# Patient Record
Sex: Male | Born: 1940 | Race: White | Hispanic: No | Marital: Married | Smoking: Never smoker
Health system: Southern US, Community
[De-identification: ages and names within clinical notes are randomized; demographics above are authoritative.]

## PROBLEM LIST (undated history)

## (undated) DIAGNOSIS — I2510 Atherosclerotic heart disease of native coronary artery without angina pectoris: Secondary | ICD-10-CM

## (undated) DIAGNOSIS — Z8719 Personal history of other diseases of the digestive system: Secondary | ICD-10-CM

## (undated) DIAGNOSIS — I10 Essential (primary) hypertension: Secondary | ICD-10-CM

## (undated) DIAGNOSIS — Z8711 Personal history of peptic ulcer disease: Secondary | ICD-10-CM

## (undated) DIAGNOSIS — R519 Headache, unspecified: Secondary | ICD-10-CM

## (undated) DIAGNOSIS — K219 Gastro-esophageal reflux disease without esophagitis: Secondary | ICD-10-CM

## (undated) DIAGNOSIS — E785 Hyperlipidemia, unspecified: Secondary | ICD-10-CM

## (undated) DIAGNOSIS — N400 Enlarged prostate without lower urinary tract symptoms: Secondary | ICD-10-CM

## (undated) HISTORY — PX: EYE SURGERY: SHX253

---
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
## 1987-04-22 HISTORY — PX: CORONARY ANGIOPLASTY: SHX604

## 2004-07-03 ENCOUNTER — Ambulatory Visit: Payer: Self-pay | Admitting: Unknown Physician Specialty

## 2007-10-11 ENCOUNTER — Ambulatory Visit: Payer: Self-pay | Admitting: Unknown Physician Specialty

## 2010-12-17 ENCOUNTER — Ambulatory Visit: Payer: Self-pay | Admitting: Unknown Physician Specialty

## 2012-08-18 ENCOUNTER — Observation Stay: Payer: Self-pay | Admitting: Family Medicine

## 2012-08-18 ENCOUNTER — Ambulatory Visit: Payer: Self-pay | Admitting: Internal Medicine

## 2012-08-18 LAB — BASIC METABOLIC PANEL
Anion Gap: 8 (ref 7–16)
BUN: 17 mg/dL (ref 7–18)
Chloride: 106 mmol/L (ref 98–107)
Creatinine: 0.96 mg/dL (ref 0.60–1.30)
EGFR (African American): >60
EGFR (Non-African Amer.): >60
Glucose: 85 mg/dL (ref 65–99)
Osmolality: 280 (ref 275–301)
Potassium: 4 mmol/L (ref 3.5–5.1)
Sodium: 140 mmol/L (ref 136–145)

## 2012-08-18 LAB — CBC
HCT: 40.9 % (ref 40.0–52.0)
MCHC: 35 g/dL (ref 32.0–36.0)
MCV: 90 fL (ref 80–100)
Platelet: 172 10*3/uL (ref 150–440)

## 2012-08-19 LAB — BASIC METABOLIC PANEL
Anion Gap: 3 — ABNORMAL LOW (ref 7–16)
Calcium, Total: 8.9 mg/dL (ref 8.5–10.1)
Co2: 30 mmol/L (ref 21–32)
Creatinine: 0.97 mg/dL (ref 0.60–1.30)
EGFR (African American): >60
EGFR (Non-African Amer.): >60
Glucose: 107 mg/dL — ABNORMAL HIGH (ref 65–99)
Osmolality: 285 (ref 275–301)
Potassium: 4 mmol/L (ref 3.5–5.1)
Sodium: 142 mmol/L (ref 136–145)

## 2012-08-19 LAB — CBC WITH DIFFERENTIAL/PLATELET
Eosinophil %: 2 %
HGB: 14.1 g/dL (ref 13.0–18.0)
Lymphocyte #: 2.3 10*3/uL (ref 1.0–3.6)
Lymphocyte %: 21.7 %
MCH: 30.9 pg (ref 26.0–34.0)
MCHC: 34.7 g/dL (ref 32.0–36.0)
MCV: 89 fL (ref 80–100)
Monocyte #: 0.8 x10 3/mm (ref 0.2–1.0)
Monocyte %: 7.7 %
Neutrophil #: 7.2 10*3/uL — ABNORMAL HIGH (ref 1.4–6.5)
Neutrophil %: 68 %
RBC: 4.54 10*6/uL (ref 4.40–5.90)

## 2012-08-19 LAB — CK TOTAL AND CKMB (NOT AT ARMC)
CK, Total: 90 U/L (ref 35–232)
CK, Total: 65 U/L (ref 35–232)
CK-MB: 0.7 ng/mL (ref 0.5–3.6)
CK-MB: <0.5 ng/mL — ABNORMAL LOW (ref 0.5–3.6)

## 2012-08-19 LAB — LIPID PANEL
Ldl Cholesterol, Calc: 47 mg/dL (ref 0–100)
VLDL Cholesterol, Calc: 28 mg/dL (ref 5–40)

## 2012-08-19 LAB — MAGNESIUM: Magnesium: 1.8 mg/dL

## 2012-10-21 ENCOUNTER — Ambulatory Visit: Payer: Self-pay | Admitting: Unknown Physician Specialty

## 2012-10-25 LAB — PATHOLOGY REPORT

## 2013-12-09 ENCOUNTER — Ambulatory Visit: Payer: Self-pay

## 2014-08-11 NOTE — Discharge Summary (Signed)
PATIENT NAME:  Colin Drake, Colin Drake MR#:  [id number] DATE OF BIRTH:  May 12, 1940  DATE OF ADMISSION:  08/18/2012 DATE OF DISCHARGE:  08/19/2012  CONSULTANTS:  Dr. Isaias Cowman  DISPOSITION:  Home.  ADMISSION DIAGNOSIS: Atypical chest pain and severe GERD.  DISCHARGE DIAGNOSES:    1.  Atypical chest pain. 2.  Severe gastroesophageal reflux disease. 3.  Benign prostatic hypertrophy. 4.  Hypercholesterolemia. 5.  Hypertension.  6.  History of coronary artery disease.   MEDICATIONS AT DISCHARGE:  Proscar 5 mg once daily, Lipitor 40 mg once daily, Zetia 10 mg once daily, atenolol 25 mg once daily, Protonix 40 mg twice daily. This is a new medication aspirin, enteric-coated, 81 mg once daily.  FOLLOW UP:   1.  Follow up with Dr. Saralyn Pilar in 1 to 2 weeks.  2.  Follow up with Dr. Vira Agar, GI in 1 to 2 weeks. 3.  Follow up with Dr. Ezequiel Kayser also in 1 to 2 weeks.   HOSPITAL COURSE:  The patient is a very nice 74 year old gentleman who has history of coronary artery disease with angioplasty about 15 years ago by Dr. Saralyn Pilar. He had a history today of intermittent substernal chest discomfort that was radiating to the right side, pressure and burning like sensation, 8 out of 10.  It is worse with exertion and resolves with rest.  The patient did not have any other associating symptoms. He was seen in the [facility] and he was sent with a possible diagnosis of unstable angina.   In the ER, the patient had a negative EKG, negative troponins and was admitted for a stress test. The stress test was done, Myoview scan showing normal left ventricular function, no evidence of pathology, no defects. The patient had a creatinine 0.96, sodium 140, other electrolytes within normal limits. His LDL was 47, his HDL was 54. His total cholesterol was 129. His troponins were negative x 3. His white count was 9.3 and 10.7 at discharge. He has had some bradycardia with a heart rate in the 40s during this  admission for which his does of atenolol was cut in half.  The patient again was evaluated by Dr. Saralyn Pilar, who is going to continue to follow up as an outpatient. He recommended to continue just medical management. He thinks that the pain was atypical and likely referred to GERD and esophageal spasm.     DISCHARGE INSTRUCTIONS:  We asked the patient to follow up with Dr. Vira Agar for GI  evaluation of the chest pain; the patient agrees as he knows Dr. Vira Agar very well. The patient has been given a prescription for Protonix to be taken twice daily to see if that helps with this problem.  The patient agrees and he is discharged in good condition.   TIME SPENT: I spent about 40 minutes with this discharge.     ____________________________ [place] Sink, MD rsg:ct D: 08/21/2012 11:23:26 ET T: 08/21/2012 15:06:53 ET JOB#: [id number]  cc: [place] Sink, MD, <Dictator> Josean Lycan America Brown MD ELECTRONICALLY SIGNED 08/28/2012 12:20

## 2014-08-11 NOTE — Consult Note (Signed)
PATIENT NAME:  Colin Drake, Colin Drake MR#:  [id number] DATE OF BIRTH:  Apr 07, 1941  DATE OF CONSULTATION:  08/19/2012  REFERRING PHYSICIAN:  Estill Bakes, MD CONSULTING PHYSICIAN:  Isaias Cowman, MD  PRIMARY CARE PHYSICIAN: Christena Flake. Raechel Ache, MD  CHIEF COMPLAINT: Chest pain.   REASON FOR CONSULTATION: Consultation requested for evaluation of chest pain.   HISTORY OF PRESENT ILLNESS: The patient is a 74 year old gentleman with known history of coronary artery disease status post prior angioplasty. The patient presented to [facility] on 08/18/2012 with 2-day history of intermittent chest discomfort described as a burning-like sensation which radiated across his chest. This was not associated with shortness of breath, diaphoresis, nausea or vomiting. Symptoms have been intermittent. He was seen at the [facility] and was sent via EMS to [facility] Emergency Room. The patient was initially found to be bradycardic. EKG was nondiagnostic. The patient ruled out for myocardial infarction by CPK isoenzymes and troponin.   PAST MEDICAL HISTORY:  1. Coronary artery disease status post angioplasty approximately 15 years ago.  2. Hyperlipidemia.  3. Gastroesophageal reflux disease.  4. BPH.  MEDICATIONS:  1. Atenolol 50 mg daily.  2. Lipitor 40 mg daily.  3. Zetia 10 mg daily.  4. Proscar 10 mg daily.  5. Lansoprazole 30 mg daily.   SOCIAL HISTORY: The patient is married, lives with his wife. He denies tobacco abuse.   FAMILY HISTORY: No immediate family history for coronary artery disease or myocardial infarction.   REVIEW OF SYSTEMS:   CONSTITUTIONAL: No fever or chills.  EYES: No blurry vision.  EARS: No hearing loss.  RESPIRATORY: No shortness of breath.  CARDIOVASCULAR: Chest discomfort as described above.  GASTROINTESTINAL: No nausea, vomiting, diarrhea or constipation.  GENITOURINARY: No dysuria or hematuria.  ENDOCRINE: No polyuria or polydipsia.  MUSCULOSKELETAL: No  arthralgias or myalgias.  NEUROLOGICAL: No focal muscle weakness, no numbness.  PSYCHOLOGICAL: No depression or anxiety.   PHYSICAL EXAMINATION:  VITAL SIGNS: Blood pressure was 146/76, pulse 57, respirations 20, temperature 97.4 and pulse oximetry 98%.  HEENT: Pupils equally reactive to light and accommodation.  NECK: Supple without thyromegaly.  LUNGS: Clear.  HEART: Normal JVP. Normal PMI. Regular rate and rhythm. Normal S1, S2. No appreciable gallop, murmur or rub.  ABDOMEN: Soft and nontender.  PULSES: Intact bilaterally.  MUSCULOSKELETAL: Normal muscle tone.  NEUROLOGICAL: The patient is alert and oriented x3. Motor and sensory both grossly intact.   IMPRESSION: A 74 year old gentleman with known history of coronary artery disease, who presents with chest pain, with negative troponin and nondiagnostic electrocardiogram.   RECOMMENDATIONS:  1. Agree with current therapy.  2. Agree with proceeding with Lexiscan sestamibi study.  3. Further recommendations, including invasive cardiac evaluation, pending Lexiscan sestamibi results.   ____________________________ Isaias Cowman, MD ap:OSi D: 08/19/2012 13:19:08 ET T: 08/19/2012 14:19:27 ET JOB#: [id number]  cc: Isaias Cowman, MD, <Dictator> Isaias Cowman MD ELECTRONICALLY SIGNED 08/30/2012 13:46

## 2014-08-11 NOTE — H&P (Signed)
PATIENT NAME:  Colin Drake, Colin Drake MR#:  [id number] DATE OF BIRTH:  07-01-1940  DATE OF ADMISSION:  08/18/2012  PRIMARY CARE PHYSICIAN: Dr. Ezequiel Kayser.   REFERRING PHYSICIAN: Dr. Benjaman Lobe.   CHIEF COMPLAINT: Chest pain.   HISTORY OF PRESENT ILLNESS: The patient is a 74 year old, pleasant, Caucasian male with a past medical history of angioplasty approximately 15 years ago by Dr. Saralyn Pilar. Has been experiencing a 2-day history of intermittent episodes of substernal chest discomfort. The patient has reported that he has been not following up Dr. Saralyn Pilar for the past several years. His intermittent episodes of chest discomfort are pressure-like and burning-like in sensation which is 8 out of 10. It gets worse with exertion and resolves at rest. It has also radiated to the left side of the chest. Denies any shortness of breath, diaphoresis or edema. As the pain is persistently present for the past 2 days, though it is intermittent in nature, the patient went to [facility]. There, as the patient was having chest pain, they diagnosed him with unstable angina, gave him 4 baby aspirin and 1 inch of nitro paste to the anterior chest wall. Following nitro paste attachment, the patient's chest pain significantly improved. The patient was eventually sent over to the ER via EMS. The patient was also found to be bradycardic with heart rate as low as 40s. In the ER, the patient was placed on oxygen, and first set of troponins were negative. A 12-lead EKG has revealed no acute ST-T wave elevations or depressions. Hospitalist team is called to admit the patient to rule out acute MI. During my examination, the patient denies any chest pain or shortness of breath. He is totally symptom-free but concerned about the 2-day history of intermittent episodes of chest pressure.   PAST MEDICAL HISTORY: GERD, hyperlipidemia, benign prostatic hypertrophy, coronary artery disease, status post angioplasty.   PAST SURGICAL  HISTORY: Angioplasty   PSYCHOSOCIAL HISTORY: Lives at home with wife. Denies any history of smoking, alcohol or illicit drug usage.   FAMILY HISTORY: Dad deceased with lung cancer at age 20, and mom deceased with cancer during his childhood and he is not sure about the type of cancer.   HOME MEDICATIONS: Zetia 10 mg once daily, Proscar 10 mg once daily, Lipitor 40 mg once daily, lansoprazole 30 mg once daily, atenolol 50 mg once daily.   REVIEW OF SYSTEMS:   CONSTITUTIONAL: Denies any fever, fatigue. No weight loss or weight gain. The patient had chest pressure, but during my examination, denies any.  EYES: Denies any blurry vision, glaucoma, cataracts.  ENT: No ear pain or discharge, snoring, postnasal drip or sinus pain.  RESPIRATION: Denies any cough, wheezing, painful respirations or COPD.   CARDIOVASCULAR: Comes in with chest pain which is resolved after attaching nitro paste. Denies any palpitations or syncope.  GASTROINTESTINAL: Denies any nausea, vomiting, diarrhea. Has history of GERD.  GENITOURINARY: No dysuria or hematuria.  ENDOCRINE: No polyuria, nocturia, thyroid problems.  HEMATOLOGIC AND LYMPHATIC: No anemia, easy bruising or bleeding.  INTEGUMENTARY: No acne, rash, lesions.  MUSCULOSKELETAL: No joint pain in the neck, shoulder, knee. Denies any history of gout.  NEUROLOGIC: Denies any history of vertigo, ataxia, dementia, headache.  PSYCHIATRIC: Denies any history of ADD, OCD, bipolar disorder.   PHYSICAL EXAMINATION:  VITAL SIGNS: Temperature 98.1, pulse 61, respirations 16 to 18, blood pressure 123/60, pulse ox 94%.  GENERAL APPEARANCE: Not in acute distress. Moderately built and moderately nourished.  HEENT: Normocephalic, atraumatic. Pupils are equally  reacting to light and accommodation. No scleral icterus. No conjunctival injection. Extraocular movements are intact. No sinus tenderness. No postnasal drip. No pharyngeal exudates.  NECK: Supple. No JVD. No thyromegaly.  No lymphadenopathy.  LUNGS: Clear to auscultation bilaterally. No accessory muscle usage. No anterior chest wall tenderness on palpation.  CARDIAC: S1, S2 normal. Regular rhythm. Sinus bradycardia is present. No murmurs. No edema.  GASTROINTESTINAL: Soft. Bowel sounds are positive in all 4 quadrants. Nontender, nondistended. No masses felt. No hepatosplenomegaly.  NEUROLOGIC: Awake, alert, oriented x3. Follows verbal commands. Cranial nerves II through XII are intact. Motor and sensory are intact. Reflexes are 2+.  EXTREMITIES: No edema. No cyanosis. No clubbing.  SKIN: No rashes, lesions. Normal turgor. Warm to touch.  MUSCULOSKELETAL: No joint effusion, tenderness or erythema.  PSYCHIATRIC: Normal mood and affect.   A 12-lead EKG has revealed sinus bradycardia with nonspecific ST-T wave changes. CARDIAC ENZYMES: CK total 90, CPK-MB 0.9, troponin less than 0.02. CBC and BMP are within normal range. Chest x-ray, PA and lateral views, has revealed no evidence of acute cardiopulmonary abnormality.   ASSESSMENT AND PLAN: A 74 year old Caucasian male with a history of angioplasty several years ago. Having 2-day history of intermittent episodes of chest pain which is pressure-like and burning-like in sensation. Was sent over to [facility] ER from [facility].  1. Acute chest pain versus unstable angina: Will rule out acute myocardial infarction. Will admit the patient to telemetry and cycle cardiac biomarkers. Cardiology consult is placed to Dr. Ubaldo Glassing who is on call for Dr. Saralyn Pilar. Will order stress test in a.m. Acute coronary syndrome protocol with oxygen, nitroglycerin, aspirin, beta blocker and statin. The patient will be n.p.o. after midnight, and while the patient is n.p.o., will provide intravenous fluids. Gastrointestinal prophylaxis with Protonix.  2. Sinus bradycardia: The patient will be on telemetry monitoring. Atenolol dose is reduced from 50 mg to 25 mg. Will continue close  monitoring. The patient is currently asymptomatic.  3. Hypotension: His blood pressure medication, atenolol, is reduced from 50 mg to 25 mg in view of bradycardia.  4. Hyperlipidemia: Continue statin.  5. Gastroesophageal reflux disease: Will provide gastrointestinal prophylaxis.  6. Benign prostatic hypertrophy: Resume his home medication.   The diagnosis and plan of care was discussed in detail with the patient. He is FULL CODE.   TOTAL TIME SPENT ON ADMISSION: 50 minutes.   ____________________________ Nicholes Mango, MD ag:gb D: 08/19/2012 03:06:09 ET T: 08/19/2012 04:08:45 ET JOB#: [id number]  cc: Nicholes Mango, MD, <Dictator> Isaias Cowman, MD Christena Flake. Raechel Ache, MD Nicholes Mango MD ELECTRONICALLY SIGNED 08/24/2012 22:40

## 2015-04-22 DIAGNOSIS — Z8619 Personal history of other infectious and parasitic diseases: Secondary | ICD-10-CM

## 2015-04-22 HISTORY — DX: Personal history of other infectious and parasitic diseases: Z86.19

## 2016-05-30 ENCOUNTER — Encounter: Payer: Self-pay | Admitting: *Deleted

## 2016-05-30 NOTE — Discharge Instructions (Signed)
General Anesthesia, Adult, Care After °These instructions provide you with information about caring for yourself after your procedure. Your health care provider may also give you more specific instructions. Your treatment has been planned according to current medical practices, but problems sometimes occur. Call your health care provider if you have any problems or questions after your procedure. °What can I expect after the procedure? °After the procedure, it is common to have: °· Vomiting. °· A sore throat. °· Mental slowness. °It is common to feel: °· Nauseous. °· Cold or shivery. °· Sleepy. °· Tired. °· Sore or achy, even in parts of your body where you did not have surgery. °Follow these instructions at home: °For at least 24 hours after the procedure: °· Do not: °¨ Participate in activities where you could fall or become injured. °¨ Drive. °¨ Use heavy machinery. °¨ Drink alcohol. °¨ Take sleeping pills or medicines that cause drowsiness. °¨ Make important decisions or sign legal documents. °¨ Take care of children on your own. °· Rest. °Eating and drinking °· If you vomit, drink water, juice, or soup when you can drink without vomiting. °· Drink enough fluid to keep your urine clear or pale yellow. °· Make sure you have little or no nausea before eating solid foods. °· Follow the diet recommended by your health care provider. °General instructions °· Have a responsible adult stay with you until you are awake and alert. °· Return to your normal activities as told by your health care provider. Ask your health care provider what activities are safe for you. °· Take over-the-counter and prescription medicines only as told by your health care provider. °· If you smoke, do not smoke without supervision. °· Keep all follow-up visits as told by your health care provider. This is important. °Contact a health care provider if: °· You continue to have nausea or vomiting at home, and medicines are not helpful. °· You  cannot drink fluids or start eating again. °· You cannot urinate after 8-12 hours. °· You develop a skin rash. °· You have fever. °· You have increasing redness at the site of your procedure. °Get help right away if: °· You have difficulty breathing. °· You have chest pain. °· You have unexpected bleeding. °· You feel that you are having a life-threatening or urgent problem. °This information is not intended to replace advice given to you by your health care provider. Make sure you discuss any questions you have with your health care provider. °Document Released: 07/14/2000 Document Revised: 09/10/2015 Document Reviewed: 03/22/2015 °Elsevier Interactive Patient Education © 2017 [employer] ° ° °Cataract Surgery, Care After °Refer to this sheet in the next few weeks. These instructions provide you with information about caring for yourself after your procedure. Your health care provider may also give you more specific instructions. Your treatment has been planned according to current medical practices, but problems sometimes occur. Call your health care provider if you have any problems or questions after your procedure. °What can I expect after the procedure? °After the procedure, it is common to have: °· Itching. °· Discomfort. °· Fluid discharge. °· Sensitivity to light and to touch. °· Bruising. °Follow these instructions at home: °Eye Care °· Check your eye every day for signs of infection. Watch for: °¨ Redness, swelling, or pain. °¨ Fluid, blood, or pus. °¨ Warmth. °¨ Bad smell. °Activity °· Avoid strenuous activities, such as playing contact sports, for as long as told by your health care provider. °· Do not   drive or operate heavy machinery until your health care provider approves. °· Do not bend or lift heavy objects . Bending increases pressure in the eye. You can walk, climb stairs, and do light household chores. °· Ask your health care provider when you can return to work. If you work in a dusty  environment, you may be advised to wear protective eyewear for a period of time. °General instructions °· Take or apply over-the-counter and prescription medicines only as told by your health care provider. This includes eye drops. °· Do not touch or rub your eyes. °· If you were given a protective shield, wear it as told by your health care provider. If you were not given a protective shield, wear sunglasses as told by your health care provider to protect your eyes. °· Keep the area around your eye clean and dry. Avoid swimming or allowing water to hit you directly in the face while showering until told by your health care provider. Keep soap and shampoo out of your eyes. °· Do not put a contact lens into the affected eye or eyes until your health care provider approves. °· Keep all follow-up visits as told by your health care provider. This is important. °Contact a health care provider if: ° °· You have increased bruising around your eye. °· You have pain that is not helped with medicine. °· You have a fever. °· You have redness, swelling, or pain in your eye. °· You have fluid, blood, or pus coming from your incision. °· Your vision gets worse. °Get help right away if: °· You have sudden vision loss. °This information is not intended to replace advice given to you by your health care provider. Make sure you discuss any questions you have with your health care provider. °Document Released: 10/25/2004 Document Revised: 08/16/2015 Document Reviewed: 02/15/2015 °Elsevier Interactive Patient Education © 2017 Elsevier Inc. ° °

## 2016-06-02 ENCOUNTER — Encounter: Payer: Self-pay | Admitting: Anesthesiology

## 2016-06-04 ENCOUNTER — Ambulatory Visit: Payer: Medicare Other | Admitting: Anesthesiology

## 2016-06-04 ENCOUNTER — Encounter
Admission: RE | Disposition: A | Discharge status: Home / Self Care | Payer: Self-pay | Source: Ambulatory Visit | Attending: Ophthalmology

## 2016-06-04 ENCOUNTER — Ambulatory Visit
Admission: RE | Admit: 2016-06-04 | Discharge: 2016-06-04 | Disposition: A | Discharge status: Home / Self Care | Payer: Medicare Other | Source: Ambulatory Visit | Attending: Ophthalmology | Admitting: Ophthalmology

## 2016-06-04 DIAGNOSIS — K219 Gastro-esophageal reflux disease without esophagitis: Secondary | ICD-10-CM | POA: Diagnosis not present

## 2016-06-04 DIAGNOSIS — H2512 Age-related nuclear cataract, left eye: Secondary | ICD-10-CM | POA: Insufficient documentation

## 2016-06-04 DIAGNOSIS — I10 Essential (primary) hypertension: Secondary | ICD-10-CM | POA: Diagnosis not present

## 2016-06-04 DIAGNOSIS — Z79899 Other long term (current) drug therapy: Secondary | ICD-10-CM | POA: Diagnosis not present

## 2016-06-04 HISTORY — PX: CATARACT EXTRACTION W/PHACO: SHX586

## 2016-06-04 HISTORY — DX: Essential (primary) hypertension: I10

## 2016-06-04 HISTORY — DX: Gastro-esophageal reflux disease without esophagitis: K21.9

## 2016-06-04 HISTORY — DX: Benign prostatic hyperplasia without lower urinary tract symptoms: N40.0

## 2016-06-04 SURGERY — PHACOEMULSIFICATION, CATARACT, WITH IOL INSERTION
Anesthesia: Monitor Anesthesia Care | Site: Eye | Laterality: Left | Wound class: Clean

## 2016-06-04 MED ORDER — ARMC OPHTHALMIC DILATING DROPS
1.0000 "application " | OPHTHALMIC | Status: DC | PRN
  Administered 2016-06-04 (×3): 1 via OPHTHALMIC

## 2016-06-04 MED ORDER — ACETAMINOPHEN 325 MG PO TABS: 325.0000 mg | ORAL_TABLET | ORAL | Status: DC | PRN

## 2016-06-04 MED ORDER — CEFUROXIME OPHTHALMIC INJECTION 1 MG/0.1 ML
INJECTION | OPHTHALMIC | Status: DC | PRN
  Administered 2016-06-04: .3 mL via OPHTHALMIC

## 2016-06-04 MED ORDER — ACETAMINOPHEN 160 MG/5ML PO SOLN: 325.0000 mg | ORAL | Status: DC | PRN

## 2016-06-04 MED ORDER — NA HYALUR & NA CHOND-NA HYALUR 0.4-0.35 ML IO KIT
PACK | INTRAOCULAR | Status: DC | PRN
  Administered 2016-06-04: 1 mL via INTRAOCULAR

## 2016-06-04 MED ORDER — LIDOCAINE HCL (PF) 2 % IJ SOLN
INTRAOCULAR | Status: DC | PRN
  Administered 2016-06-04: 2 mL via INTRAOCULAR

## 2016-06-04 MED ORDER — LACTATED RINGERS IV SOLN: INTRAVENOUS | Status: DC

## 2016-06-04 MED ORDER — FENTANYL CITRATE (PF) 100 MCG/2ML IJ SOLN
INTRAMUSCULAR | Status: DC | PRN
  Administered 2016-06-04: 50 ug via INTRAVENOUS

## 2016-06-04 MED ORDER — EPINEPHRINE PF 1 MG/ML IJ SOLN
INTRAOCULAR | Status: DC | PRN
  Administered 2016-06-04: 54 mL via OPHTHALMIC

## 2016-06-04 MED ORDER — BRIMONIDINE TARTRATE-TIMOLOL 0.2-0.5 % OP SOLN
OPHTHALMIC | Status: DC | PRN
  Administered 2016-06-04: 1 [drp] via OPHTHALMIC

## 2016-06-04 MED ORDER — MIDAZOLAM HCL 2 MG/2ML IJ SOLN
INTRAMUSCULAR | Status: DC | PRN
  Administered 2016-06-04: 2 mg via INTRAVENOUS

## 2016-06-04 MED ORDER — LACTATED RINGERS IV SOLN: 500.0000 mL | INTRAVENOUS | Status: DC

## 2016-06-04 MED ORDER — MOXIFLOXACIN HCL 0.5 % OP SOLN
1.0000 [drp] | OPHTHALMIC | Status: DC | PRN
  Administered 2016-06-04 (×3): 1 [drp] via OPHTHALMIC

## 2016-06-04 SURGICAL SUPPLY — 26 items
CANNULA ANT/CHMB 27GA (MISCELLANEOUS) ×3 IMPLANT
CARTRIDGE ABBOTT (MISCELLANEOUS) IMPLANT
GLOVE SURG LX 7.5 STRW (GLOVE) ×2
GLOVE SURG LX STRL 7.5 STRW (GLOVE) ×1 IMPLANT
GLOVE SURG TRIUMPH 8.0 PF LTX (GLOVE) ×3 IMPLANT
GOWN STRL REUS W/ TWL LRG LVL3 (GOWN DISPOSABLE) ×2 IMPLANT
GOWN STRL REUS W/TWL LRG LVL3 (GOWN DISPOSABLE) ×4
LENS IOL TECNIS TRC I 225 15.0 ×1 IMPLANT
LENS IOL TORIC 15.0 ×3 IMPLANT
MARKER SKIN DUAL TIP RULER LAB (MISCELLANEOUS) ×3 IMPLANT
NDL RETROBULBAR .5 NSTRL (NEEDLE) IMPLANT
NEEDLE FILTER BLUNT 18X 1/2SAF (NEEDLE) ×2
NEEDLE FILTER BLUNT 18X1 1/2 (NEEDLE) ×1 IMPLANT
PACK CATARACT BRASINGTON (MISCELLANEOUS) ×3 IMPLANT
PACK EYE AFTER SURG (MISCELLANEOUS) ×3 IMPLANT
PACK OPTHALMIC (MISCELLANEOUS) ×3 IMPLANT
RING MALYGIN 7.0 (MISCELLANEOUS) IMPLANT
SUT ETHILON 10-0 CS-B-6CS-B-6 (SUTURE)
SUT VICRYL  9 0 (SUTURE)
SUT VICRYL 9 0 (SUTURE) IMPLANT
SUTURE EHLN 10-0 CS-B-6CS-B-6 (SUTURE) IMPLANT
SYR 3ML LL SCALE MARK (SYRINGE) ×3 IMPLANT
SYR 5ML LL (SYRINGE) ×3 IMPLANT
SYR TB 1ML LUER SLIP (SYRINGE) ×3 IMPLANT
WATER STERILE IRR 250ML POUR (IV SOLUTION) ×3 IMPLANT
WIPE NON LINTING 3.25X3.25 (MISCELLANEOUS) ×3 IMPLANT

## 2016-06-04 NOTE — H&P (Signed)
The History and Physical notes are on paper, have been signed, and are to be scanned. The patient remains stable and unchanged from the H&P.   Previous H&P reviewed, patient examined, and there are no changes.  Colin Drake 06/04/2016 9:24 AM

## 2016-06-04 NOTE — Op Note (Signed)
LOCATION:  [place]   PREOPERATIVE DIAGNOSIS:  Nuclear sclerotic cataract of the left eye.  H25.12  POSTOPERATIVE DIAGNOSIS:  Nuclear sclerotic cataract of the left eye.   PROCEDURE:  Phacoemulsification with Toric posterior chamber intraocular lens placement of the left eye.   LENS:  Implant Name Type Inv. Item Serial No. Manufacturer Lot No. LRB No. Used  Tecnis Toric Aspheric IOL     [id number] [facility]   Left 1   [id number] Toric intraocular lens with 2.25 diopters of cylindrical power with axis orientation at 172 degrees.   ULTRASOUND TIME: 14 % of 0 minutes, 59 seconds.  CDE 8.5   SURGEON:  Wyonia Hough, MD   ANESTHESIA:  Topical with tetracaine drops and 2% Xylocaine jelly, augmented with 1% preservative-free intracameral lidocaine.  COMPLICATIONS:  None.   DESCRIPTION OF PROCEDURE:  The patient was identified in the holding room and transported to the operating suite and placed in the supine position under the operating microscope.  The left eye was identified as the operative eye, and it was prepped and draped in the usual sterile ophthalmic fashion.    A clear-corneal paracentesis incision was made at the 1:30 position.  0.5 ml of preservative-free 1% lidocaine was injected into the anterior chamber. The anterior chamber was filled with Viscoat.  A 2.4 millimeter near clear corneal incision was then made at the 10:30 position.  A cystotome and capsulorrhexis forceps were then used to make a curvilinear capsulorrhexis.  Hydrodissection and hydrodelineation were then performed using balanced salt solution.   Phacoemulsification was then used in stop and chop fashion to remove the lens, nucleus and epinucleus.  The remaining cortex was aspirated using the irrigation and aspiration handpiece.  Provisc viscoelastic was then placed into the capsular bag to distend it for lens placement.  The Verion digital marker was used to align the implant at the intended axis.   A 15.0 diopter lens was then injected into the capsular bag.  It was rotated clockwise until the axis marks on the lens were approximately 15 degrees in the counterclockwise direction to the intended alignment.  The viscoelastic was aspirated from the eye using the irrigation aspiration handpiece.  Then, a Koch spatula through the sideport incision was used to rotate the lens in a clockwise direction until the axis markings of the intraocular lens were lined up with the Verion alignment.  Balanced salt solution was then used to hydrate the wounds. Cefuroxime 0.1 ml of a 10mg /ml solution was injected into the anterior chamber for a dose of 1 mg of intracameral antibiotic at the completion of the case.    The eye was noted to have a physiologic pressure and there was no wound leak noted.   Timolol and Brimonidine drops were applied to the eye.  The patient was taken to the recovery room in stable condition having had no complications of anesthesia or surgery.  Handy Mcloud 06/04/2016, 10:49 AM

## 2016-06-04 NOTE — Anesthesia Postprocedure Evaluation (Signed)
Anesthesia Post Note  Patient: Colin Drake  Procedure(s) Performed: Procedure(s) (LRB): CATARACT EXTRACTION PHACO AND INTRAOCULAR LENS PLACEMENT (IOC)  Left Toric lens (Left)  Patient location during evaluation: PACU Anesthesia Type: MAC Level of consciousness: awake and alert Pain management: pain level controlled Vital Signs Assessment: post-procedure vital signs reviewed and stable Respiratory status: spontaneous breathing, nonlabored ventilation and respiratory function stable Cardiovascular status: stable and blood pressure returned to baseline Anesthetic complications: no    Kimori Tartaglia D Tiaira Arambula

## 2016-06-04 NOTE — Anesthesia Preprocedure Evaluation (Signed)
Anesthesia Evaluation  Patient identified by MRN, date of birth, ID band Patient awake    Reviewed: Allergy & Precautions, H&P , NPO status , Patient's Chart, lab work & pertinent test results, reviewed documented beta blocker date and time   Airway Mallampati: II  TM Distance: >3 FB Neck ROM: full    Dental no notable dental hx.    Pulmonary neg pulmonary ROS,    Pulmonary exam normal breath sounds clear to auscultation       Cardiovascular Exercise Tolerance: Good hypertension,  Rhythm:regular Rate:Normal     Neuro/Psych negative neurological ROS  negative psych ROS   GI/Hepatic Neg liver ROS, GERD  Medicated,  Endo/Other  negative endocrine ROS  Renal/GU negative Renal ROS  negative genitourinary   Musculoskeletal   Abdominal   Peds  Hematology negative hematology ROS (+)   Anesthesia Other Findings   Reproductive/Obstetrics negative OB ROS                             Anesthesia Physical Anesthesia Plan  ASA: II  Anesthesia Plan: MAC   Post-op Pain Management:    Induction:   Airway Management Planned:   Additional Equipment:   Intra-op Plan:   Post-operative Plan:   Informed Consent:   Plan Discussed with: CRNA  Anesthesia Plan Comments:         Anesthesia Quick Evaluation

## 2016-06-04 NOTE — Transfer of Care (Signed)
Immediate Anesthesia Transfer of Care Note  Patient: Colin Drake  Procedure(s) Performed: Procedure(s) with comments: CATARACT EXTRACTION PHACO AND INTRAOCULAR LENS PLACEMENT (IOC)  Left Toric lens (Left) - Toric lens  Patient Location: PACU  Anesthesia Type: MAC  Level of Consciousness: awake, alert  and patient cooperative  Airway and Oxygen Therapy: Patient Spontanous Breathing and Patient connected to supplemental oxygen  Post-op Assessment: Post-op Vital signs reviewed, Patient's Cardiovascular Status Stable, Respiratory Function Stable, Patent Airway and No signs of Nausea or vomiting  Post-op Vital Signs: Reviewed and stable  Complications: No apparent anesthesia complications

## 2016-06-05 ENCOUNTER — Encounter: Payer: Self-pay | Admitting: Ophthalmology

## 2016-06-20 ENCOUNTER — Encounter: Payer: Self-pay | Admitting: *Deleted

## 2016-06-20 NOTE — Discharge Instructions (Signed)
Cataract Surgery, Care After °Refer to this sheet in the next few weeks. These instructions provide you with information about caring for yourself after your procedure. Your health care provider may also give you more specific instructions. Your treatment has been planned according to current medical practices, but problems sometimes occur. Call your health care provider if you have any problems or questions after your procedure. °What can I expect after the procedure? °After the procedure, it is common to have: °· Itching. °· Discomfort. °· Fluid discharge. °· Sensitivity to light and to touch. °· Bruising. °Follow these instructions at home: °Eye Care  °· Check your eye every day for signs of infection. Watch for: °¨ Redness, swelling, or pain. °¨ Fluid, blood, or pus. °¨ Warmth. °¨ Bad smell. °Activity  °· Avoid strenuous activities, such as playing contact sports, for as long as told by your health care provider. °· Do not drive or operate heavy machinery until your health care provider approves. °· Do not bend or lift heavy objects . Bending increases pressure in the eye. You can walk, climb stairs, and do light household chores. °· Ask your health care provider when you can return to work. If you work in a dusty environment, you may be advised to wear protective eyewear for a period of time. °General instructions  °· Take or apply over-the-counter and prescription medicines only as told by your health care provider. This includes eye drops. °· Do not touch or rub your eyes. °· If you were given a protective shield, wear it as told by your health care provider. If you were not given a protective shield, wear sunglasses as told by your health care provider to protect your eyes. °· Keep the area around your eye clean and dry. Avoid swimming or allowing water to hit you directly in the face while showering until told by your health care provider. Keep soap and shampoo out of your eyes. °· Do not put a contact lens  into the affected eye or eyes until your health care provider approves. °· Keep all follow-up visits as told by your health care provider. This is important. °Contact a health care provider if: ° °· You have increased bruising around your eye. °· You have pain that is not helped with medicine. °· You have a fever. °· You have redness, swelling, or pain in your eye. °· You have fluid, blood, or pus coming from your incision. °· Your vision gets worse. °Get help right away if: °· You have sudden vision loss. °This information is not intended to replace advice given to you by your health care provider. Make sure you discuss any questions you have with your health care provider. °Document Released: 10/25/2004 Document Revised: 08/16/2015 Document Reviewed: 02/15/2015 °Elsevier Interactive Patient Education © 2017 [employer] ° ° ° ° °General Anesthesia, Adult, Care After °These instructions provide you with information about caring for yourself after your procedure. Your health care provider may also give you more specific instructions. Your treatment has been planned according to current medical practices, but problems sometimes occur. Call your health care provider if you have any problems or questions after your procedure. °What can I expect after the procedure? °After the procedure, it is common to have: °· Vomiting. °· A sore throat. °· Mental slowness. °It is common to feel: °· Nauseous. °· Cold or shivery. °· Sleepy. °· Tired. °· Sore or achy, even in parts of your body where you did not have surgery. °Follow these instructions at   home: °For at least 24 hours after the procedure:  °· Do not: °¨ Participate in activities where you could fall or become injured. °¨ Drive. °¨ Use heavy machinery. °¨ Drink alcohol. °¨ Take sleeping pills or medicines that cause drowsiness. °¨ Make important decisions or sign legal documents. °¨ Take care of children on your own. °· Rest. °Eating and drinking  °· If you vomit, drink  water, juice, or soup when you can drink without vomiting. °· Drink enough fluid to keep your urine clear or pale yellow. °· Make sure you have little or no nausea before eating solid foods. °· Follow the diet recommended by your health care provider. °General instructions  °· Have a responsible adult stay with you until you are awake and alert. °· Return to your normal activities as told by your health care provider. Ask your health care provider what activities are safe for you. °· Take over-the-counter and prescription medicines only as told by your health care provider. °· If you smoke, do not smoke without supervision. °· Keep all follow-up visits as told by your health care provider. This is important. °Contact a health care provider if: °· You continue to have nausea or vomiting at home, and medicines are not helpful. °· You cannot drink fluids or start eating again. °· You cannot urinate after 8-12 hours. °· You develop a skin rash. °· You have fever. °· You have increasing redness at the site of your procedure. °Get help right away if: °· You have difficulty breathing. °· You have chest pain. °· You have unexpected bleeding. °· You feel that you are having a life-threatening or urgent problem. °This information is not intended to replace advice given to you by your health care provider. Make sure you discuss any questions you have with your health care provider. °Document Released: 07/14/2000 Document Revised: 09/10/2015 Document Reviewed: 03/22/2015 °Elsevier Interactive Patient Education © 2017 [employer] ° °

## 2016-06-25 ENCOUNTER — Encounter
Admission: RE | Disposition: A | Discharge status: Home / Self Care | Payer: Self-pay | Source: Ambulatory Visit | Attending: Ophthalmology

## 2016-06-25 ENCOUNTER — Ambulatory Visit: Payer: Medicare Other | Admitting: Anesthesiology

## 2016-06-25 ENCOUNTER — Ambulatory Visit
Admission: RE | Admit: 2016-06-25 | Discharge: 2016-06-25 | Disposition: A | Discharge status: Home / Self Care | Payer: Medicare Other | Source: Ambulatory Visit | Attending: Ophthalmology | Admitting: Ophthalmology

## 2016-06-25 DIAGNOSIS — Z79899 Other long term (current) drug therapy: Secondary | ICD-10-CM | POA: Diagnosis not present

## 2016-06-25 DIAGNOSIS — Z7982 Long term (current) use of aspirin: Secondary | ICD-10-CM | POA: Insufficient documentation

## 2016-06-25 DIAGNOSIS — I10 Essential (primary) hypertension: Secondary | ICD-10-CM | POA: Insufficient documentation

## 2016-06-25 DIAGNOSIS — Y831 Surgical operation with implant of artificial internal device as the cause of abnormal reaction of the patient, or of later complication, without mention of misadventure at the time of the procedure: Secondary | ICD-10-CM | POA: Diagnosis not present

## 2016-06-25 DIAGNOSIS — K219 Gastro-esophageal reflux disease without esophagitis: Secondary | ICD-10-CM | POA: Diagnosis not present

## 2016-06-25 DIAGNOSIS — T8522XA Displacement of intraocular lens, initial encounter: Secondary | ICD-10-CM | POA: Insufficient documentation

## 2016-06-25 HISTORY — PX: REPOSITION OF LENS: SHX6069

## 2016-06-25 SURGERY — REPOSITIONING, IOL
Anesthesia: Monitor Anesthesia Care | Site: Eye | Laterality: Left | Wound class: Clean

## 2016-06-25 MED ORDER — LIDOCAINE HCL (PF) 2 % IJ SOLN
INTRAOCULAR | Status: DC | PRN
  Administered 2016-06-25: 1 mL via INTRAOCULAR

## 2016-06-25 MED ORDER — EPINEPHRINE PF 1 MG/ML IJ SOLN
INTRAMUSCULAR | Status: DC | PRN
  Administered 2016-06-25: 19 mL via OPHTHALMIC

## 2016-06-25 MED ORDER — MIDAZOLAM HCL 2 MG/2ML IJ SOLN
INTRAMUSCULAR | Status: DC | PRN
  Administered 2016-06-25: 2 mg via INTRAVENOUS

## 2016-06-25 MED ORDER — BRIMONIDINE TARTRATE-TIMOLOL 0.2-0.5 % OP SOLN
OPHTHALMIC | Status: DC | PRN
  Administered 2016-06-25: 1 [drp] via OPHTHALMIC

## 2016-06-25 MED ORDER — ARMC OPHTHALMIC DILATING DROPS
1.0000 "application " | OPHTHALMIC | Status: DC | PRN
  Administered 2016-06-25 (×3): 1 via OPHTHALMIC

## 2016-06-25 MED ORDER — ACETAMINOPHEN 160 MG/5ML PO SOLN: 325.0000 mg | ORAL | Status: DC | PRN

## 2016-06-25 MED ORDER — FENTANYL CITRATE (PF) 100 MCG/2ML IJ SOLN
INTRAMUSCULAR | Status: DC | PRN
  Administered 2016-06-25: 100 ug via INTRAVENOUS

## 2016-06-25 MED ORDER — MOXIFLOXACIN HCL 0.5 % OP SOLN
1.0000 [drp] | OPHTHALMIC | Status: DC | PRN
  Administered 2016-06-25 (×3): 1 [drp] via OPHTHALMIC

## 2016-06-25 MED ORDER — CEFUROXIME OPHTHALMIC INJECTION 1 MG/0.1 ML
INJECTION | OPHTHALMIC | Status: DC | PRN
  Administered 2016-06-25: 0.1 mL via OPHTHALMIC

## 2016-06-25 MED ORDER — ACETAMINOPHEN 325 MG PO TABS: 325.0000 mg | ORAL_TABLET | ORAL | Status: DC | PRN

## 2016-06-25 MED ORDER — NA HYALUR & NA CHOND-NA HYALUR 0.4-0.35 ML IO KIT
PACK | INTRAOCULAR | Status: DC | PRN
  Administered 2016-06-25: 1 mL via INTRAOCULAR

## 2016-06-25 SURGICAL SUPPLY — 24 items
CANNULA ANT/CHMB 27GA (MISCELLANEOUS) ×3 IMPLANT
CARTRIDGE ABBOTT (MISCELLANEOUS) IMPLANT
GLOVE SURG LX 7.5 STRW (GLOVE) ×2
GLOVE SURG LX STRL 7.5 STRW (GLOVE) ×1 IMPLANT
GLOVE SURG TRIUMPH 8.0 PF LTX (GLOVE) ×3 IMPLANT
GOWN STRL REUS W/ TWL LRG LVL3 (GOWN DISPOSABLE) ×2 IMPLANT
GOWN STRL REUS W/TWL LRG LVL3 (GOWN DISPOSABLE) ×4
MARKER SKIN DUAL TIP RULER LAB (MISCELLANEOUS) ×3 IMPLANT
NDL RETROBULBAR .5 NSTRL (NEEDLE) IMPLANT
NEEDLE FILTER BLUNT 18X 1/2SAF (NEEDLE) ×2
NEEDLE FILTER BLUNT 18X1 1/2 (NEEDLE) ×1 IMPLANT
PACK CATARACT BRASINGTON (MISCELLANEOUS) ×3 IMPLANT
PACK EYE AFTER SURG (MISCELLANEOUS) ×3 IMPLANT
PACK OPTHALMIC (MISCELLANEOUS) ×3 IMPLANT
RING MALYGIN 7.0 (MISCELLANEOUS) IMPLANT
SUT ETHILON 10-0 CS-B-6CS-B-6 (SUTURE) ×3
SUT VICRYL  9 0 (SUTURE)
SUT VICRYL 9 0 (SUTURE) IMPLANT
SUTURE EHLN 10-0 CS-B-6CS-B-6 (SUTURE) ×1 IMPLANT
SYR 3ML LL SCALE MARK (SYRINGE) ×3 IMPLANT
SYR 5ML LL (SYRINGE) ×3 IMPLANT
SYR TB 1ML LUER SLIP (SYRINGE) ×3 IMPLANT
WATER STERILE IRR 250ML POUR (IV SOLUTION) ×3 IMPLANT
WIPE NON LINTING 3.25X3.25 (MISCELLANEOUS) ×3 IMPLANT

## 2016-06-25 NOTE — Anesthesia Preprocedure Evaluation (Signed)
Anesthesia Evaluation  Patient identified by MRN, date of birth, ID band Patient awake    Reviewed: Allergy & Precautions, H&P , NPO status , Patient's Chart, lab work & pertinent test results, reviewed documented beta blocker date and time   Airway Mallampati: II  TM Distance: >3 FB Neck ROM: full    Dental no notable dental hx.    Pulmonary neg pulmonary ROS,    Pulmonary exam normal breath sounds clear to auscultation       Cardiovascular Exercise Tolerance: Good hypertension,  Rhythm:regular Rate:Normal     Neuro/Psych negative neurological ROS  negative psych ROS   GI/Hepatic Neg liver ROS, GERD  Medicated,  Endo/Other  negative endocrine ROS  Renal/GU negative Renal ROS  negative genitourinary   Musculoskeletal   Abdominal   Peds  Hematology negative hematology ROS (+)   Anesthesia Other Findings   Reproductive/Obstetrics negative OB ROS                             Anesthesia Physical  Anesthesia Plan  ASA: II  Anesthesia Plan: MAC   Post-op Pain Management:    Induction:   Airway Management Planned:   Additional Equipment:   Intra-op Plan:   Post-operative Plan:   Informed Consent:   Plan Discussed with: CRNA  Anesthesia Plan Comments:         Anesthesia Quick Evaluation

## 2016-06-25 NOTE — Anesthesia Procedure Notes (Signed)
Procedure Name: MAC Performed by: Mayme Genta Pre-anesthesia Checklist: Patient identified, Emergency Drugs available, Suction available, Timeout performed and Patient being monitored Patient Re-evaluated:Patient Re-evaluated prior to inductionOxygen Delivery Method: Nasal cannula Placement Confirmation: positive ETCO2

## 2016-06-25 NOTE — Op Note (Signed)
OPERATIVE NOTE  Colin Drake [id number] 06/25/2016   PREOPERATIVE DIAGNOSIS:  Malposition of intrataocular lens left eye   POSTOPERATIVE DIAGNOSIS:  Malposition of intrataocular lens left eye     PROCEDURE:  Reposition of intraocular lens placement of the left eye     SURGEON:  Wyonia Hough, MD   ANESTHESIA:  Topical with tetracaine drops and 2% Xylocaine jelly, augmented with 1% preservative-free intracameral lidocaine.    COMPLICATIONS:  None.   DESCRIPTION OF PROCEDURE:  The patient was identified in the holding room and transported to the operating room and placed in the supine position under the operating microscope.  The left eye was identified as the operative eye and it was prepped and draped in the usual sterile ophthalmic fashion.   A 1 millimeter clear-corneal paracentesis was made at the 1:30 position.  0.5 ml of preservative-free 1% lidocaine was injected into the anterior chamber.  The anterior chamber was filled with Provisc viscoelastic.  A 2.4 millimeter keratome was used to make a near-clear corneal incision at the 10:30 position.  . Provisc was used to viscodissect the IOL.  A Sinskey hook was used top rotate the IOL to a position of 166 degrees.  The remaining viscoelastic was aspirated. Two 10-0 nylon sutures were placed through the 2.4 mm incision.   Wounds were hydrated with balanced salt solution.  The anterior chamber was inflated to a physiologic pressure with balanced salt solution.  No wound leaks were noted. Cefuroxime 0.1 ml of a 10mg /ml solution was injected into the anterior chamber for a dose of 1 mg of intracameral antibiotic at the completion of the case.   Timolol and Brimonidine drops were applied to the eye.  The patient was taken to the recovery room in stable condition without complications of anesthesia or surgery.  [name] 06/25/2016, 11:32 AM

## 2016-06-25 NOTE — Anesthesia Postprocedure Evaluation (Signed)
Anesthesia Post Note  Patient: Colin Drake  Procedure(s) Performed: Procedure(s) (LRB): REPOSITION OF LENS (Left)  Patient location during evaluation: PACU Anesthesia Type: MAC Level of consciousness: awake and alert Pain management: pain level controlled Vital Signs Assessment: post-procedure vital signs reviewed and stable Respiratory status: spontaneous breathing, nonlabored ventilation, respiratory function stable and patient connected to nasal cannula oxygen Cardiovascular status: stable and blood pressure returned to baseline Anesthetic complications: no    Alisa Graff

## 2016-06-25 NOTE — H&P (Signed)
The History and Physical notes are on paper, have been signed, and are to be scanned. The patient remains stable and unchanged from the H&P.   Previous H&P reviewed, patient examined, and there are no changes.  Colin Drake 06/25/2016 10:17 AM

## 2016-06-25 NOTE — Transfer of Care (Signed)
Immediate Anesthesia Transfer of Care Note  Patient: Colin Drake  Procedure(s) Performed: Procedure(s): REPOSITION OF LENS (Left)  Patient Location: PACU  Anesthesia Type: MAC  Level of Consciousness: awake, alert  and patient cooperative  Airway and Oxygen Therapy: Patient Spontanous Breathing and Patient connected to supplemental oxygen  Post-op Assessment: Post-op Vital signs reviewed, Patient's Cardiovascular Status Stable, Respiratory Function Stable, Patent Airway and No signs of Nausea or vomiting  Post-op Vital Signs: Reviewed and stable  Complications: No apparent anesthesia complications

## 2016-06-26 ENCOUNTER — Encounter: Payer: Self-pay | Admitting: Ophthalmology

## 2016-07-23 ENCOUNTER — Encounter: Payer: Self-pay | Admitting: *Deleted

## 2016-07-29 NOTE — Discharge Instructions (Signed)
Cataract Surgery, Care After °Refer to this sheet in the next few weeks. These instructions provide you with information about caring for yourself after your procedure. Your health care provider may also give you more specific instructions. Your treatment has been planned according to current medical practices, but problems sometimes occur. Call your health care provider if you have any problems or questions after your procedure. °What can I expect after the procedure? °After the procedure, it is common to have: °· Itching. °· Discomfort. °· Fluid discharge. °· Sensitivity to light and to touch. °· Bruising. °Follow these instructions at home: °Eye Care  °· Check your eye every day for signs of infection. Watch for: °¨ Redness, swelling, or pain. °¨ Fluid, blood, or pus. °¨ Warmth. °¨ Bad smell. °Activity  °· Avoid strenuous activities, such as playing contact sports, for as long as told by your health care provider. °· Do not drive or operate heavy machinery until your health care provider approves. °· Do not bend or lift heavy objects . Bending increases pressure in the eye. You can walk, climb stairs, and do light household chores. °· Ask your health care provider when you can return to work. If you work in a dusty environment, you may be advised to wear protective eyewear for a period of time. °General instructions  °· Take or apply over-the-counter and prescription medicines only as told by your health care provider. This includes eye drops. °· Do not touch or rub your eyes. °· If you were given a protective shield, wear it as told by your health care provider. If you were not given a protective shield, wear sunglasses as told by your health care provider to protect your eyes. °· Keep the area around your eye clean and dry. Avoid swimming or allowing water to hit you directly in the face while showering until told by your health care provider. Keep soap and shampoo out of your eyes. °· Do not put a contact lens  into the affected eye or eyes until your health care provider approves. °· Keep all follow-up visits as told by your health care provider. This is important. °Contact a health care provider if: ° °· You have increased bruising around your eye. °· You have pain that is not helped with medicine. °· You have a fever. °· You have redness, swelling, or pain in your eye. °· You have fluid, blood, or pus coming from your incision. °· Your vision gets worse. °Get help right away if: °· You have sudden vision loss. °This information is not intended to replace advice given to you by your health care provider. Make sure you discuss any questions you have with your health care provider. °Document Released: 10/25/2004 Document Revised: 08/16/2015 Document Reviewed: 02/15/2015 °Elsevier Interactive Patient Education © 2017 [employer] ° ° ° ° °General Anesthesia, Adult, Care After °These instructions provide you with information about caring for yourself after your procedure. Your health care provider may also give you more specific instructions. Your treatment has been planned according to current medical practices, but problems sometimes occur. Call your health care provider if you have any problems or questions after your procedure. °What can I expect after the procedure? °After the procedure, it is common to have: °· Vomiting. °· A sore throat. °· Mental slowness. °It is common to feel: °· Nauseous. °· Cold or shivery. °· Sleepy. °· Tired. °· Sore or achy, even in parts of your body where you did not have surgery. °Follow these instructions at   home: °For at least 24 hours after the procedure:  °· Do not: °¨ Participate in activities where you could fall or become injured. °¨ Drive. °¨ Use heavy machinery. °¨ Drink alcohol. °¨ Take sleeping pills or medicines that cause drowsiness. °¨ Make important decisions or sign legal documents. °¨ Take care of children on your own. °· Rest. °Eating and drinking  °· If you vomit, drink  water, juice, or soup when you can drink without vomiting. °· Drink enough fluid to keep your urine clear or pale yellow. °· Make sure you have little or no nausea before eating solid foods. °· Follow the diet recommended by your health care provider. °General instructions  °· Have a responsible adult stay with you until you are awake and alert. °· Return to your normal activities as told by your health care provider. Ask your health care provider what activities are safe for you. °· Take over-the-counter and prescription medicines only as told by your health care provider. °· If you smoke, do not smoke without supervision. °· Keep all follow-up visits as told by your health care provider. This is important. °Contact a health care provider if: °· You continue to have nausea or vomiting at home, and medicines are not helpful. °· You cannot drink fluids or start eating again. °· You cannot urinate after 8-12 hours. °· You develop a skin rash. °· You have fever. °· You have increasing redness at the site of your procedure. °Get help right away if: °· You have difficulty breathing. °· You have chest pain. °· You have unexpected bleeding. °· You feel that you are having a life-threatening or urgent problem. °This information is not intended to replace advice given to you by your health care provider. Make sure you discuss any questions you have with your health care provider. °Document Released: 07/14/2000 Document Revised: 09/10/2015 Document Reviewed: 03/22/2015 °Elsevier Interactive Patient Education © 2017 [employer] ° °

## 2016-07-30 ENCOUNTER — Ambulatory Visit
Admission: RE | Admit: 2016-07-30 | Discharge: 2016-07-30 | Disposition: A | Discharge status: Home / Self Care | Payer: Medicare Other | Source: Ambulatory Visit | Attending: Ophthalmology | Admitting: Ophthalmology

## 2016-07-30 ENCOUNTER — Ambulatory Visit: Payer: Medicare Other | Admitting: Anesthesiology

## 2016-07-30 ENCOUNTER — Encounter
Admission: RE | Disposition: A | Discharge status: Home / Self Care | Payer: Self-pay | Source: Ambulatory Visit | Attending: Ophthalmology

## 2016-07-30 DIAGNOSIS — I10 Essential (primary) hypertension: Secondary | ICD-10-CM | POA: Insufficient documentation

## 2016-07-30 DIAGNOSIS — H2511 Age-related nuclear cataract, right eye: Secondary | ICD-10-CM | POA: Insufficient documentation

## 2016-07-30 DIAGNOSIS — H9192 Unspecified hearing loss, left ear: Secondary | ICD-10-CM | POA: Insufficient documentation

## 2016-07-30 HISTORY — PX: CATARACT EXTRACTION W/PHACO: SHX586

## 2016-07-30 SURGERY — PHACOEMULSIFICATION, CATARACT, WITH IOL INSERTION
Anesthesia: Monitor Anesthesia Care | Site: Eye | Laterality: Right | Wound class: Clean

## 2016-07-30 MED ORDER — EPINEPHRINE PF 1 MG/ML IJ SOLN
INTRAOCULAR | Status: DC | PRN
  Administered 2016-07-30: 60 mL via OPHTHALMIC

## 2016-07-30 MED ORDER — FENTANYL CITRATE (PF) 100 MCG/2ML IJ SOLN
INTRAMUSCULAR | Status: DC | PRN
  Administered 2016-07-30 (×2): 50 ug via INTRAVENOUS

## 2016-07-30 MED ORDER — BRIMONIDINE TARTRATE-TIMOLOL 0.2-0.5 % OP SOLN
OPHTHALMIC | Status: DC | PRN
  Administered 2016-07-30: 1 [drp] via OPHTHALMIC

## 2016-07-30 MED ORDER — CEFUROXIME OPHTHALMIC INJECTION 1 MG/0.1 ML
INJECTION | OPHTHALMIC | Status: DC | PRN
  Administered 2016-07-30: 0.1 mL via INTRACAMERAL

## 2016-07-30 MED ORDER — LACTATED RINGERS IV SOLN: INTRAVENOUS | Status: DC

## 2016-07-30 MED ORDER — BALANCED SALT IO SOLN
INTRAOCULAR | Status: DC | PRN
  Administered 2016-07-30: 1 mL via INTRAOCULAR

## 2016-07-30 MED ORDER — MOXIFLOXACIN HCL 0.5 % OP SOLN
1.0000 [drp] | OPHTHALMIC | Status: DC | PRN
  Administered 2016-07-30 (×3): 1 [drp] via OPHTHALMIC

## 2016-07-30 MED ORDER — ARMC OPHTHALMIC DILATING DROPS
1.0000 "application " | OPHTHALMIC | Status: DC | PRN
  Administered 2016-07-30 (×3): 1 via OPHTHALMIC

## 2016-07-30 MED ORDER — NA HYALUR & NA CHOND-NA HYALUR 0.4-0.35 ML IO KIT
PACK | INTRAOCULAR | Status: DC | PRN
  Administered 2016-07-30: 1 mL via INTRAOCULAR

## 2016-07-30 MED ORDER — MIDAZOLAM HCL 2 MG/2ML IJ SOLN
INTRAMUSCULAR | Status: DC | PRN
  Administered 2016-07-30: 2 mg via INTRAVENOUS

## 2016-07-30 SURGICAL SUPPLY — 26 items
CANNULA ANT/CHMB 27GA (MISCELLANEOUS) ×2 IMPLANT
CARTRIDGE ABBOTT (MISCELLANEOUS) IMPLANT
GLOVE SURG LX 7.5 STRW (GLOVE) ×1
GLOVE SURG LX STRL 7.5 STRW (GLOVE) ×1 IMPLANT
GLOVE SURG TRIUMPH 8.0 PF LTX (GLOVE) ×2 IMPLANT
GOWN STRL REUS W/ TWL LRG LVL3 (GOWN DISPOSABLE) ×2 IMPLANT
GOWN STRL REUS W/TWL LRG LVL3 (GOWN DISPOSABLE) ×2
LENS IOL TECNIS TRC I 225 15.0 ×1 IMPLANT
LENS IOL TORIC 15.0 ×2 IMPLANT
MARKER SKIN DUAL TIP RULER LAB (MISCELLANEOUS) ×2 IMPLANT
NDL RETROBULBAR .5 NSTRL (NEEDLE) IMPLANT
NEEDLE FILTER BLUNT 18X 1/2SAF (NEEDLE) ×1
NEEDLE FILTER BLUNT 18X1 1/2 (NEEDLE) ×1 IMPLANT
PACK CATARACT BRASINGTON (MISCELLANEOUS) ×2 IMPLANT
PACK EYE AFTER SURG (MISCELLANEOUS) ×2 IMPLANT
PACK OPTHALMIC (MISCELLANEOUS) ×2 IMPLANT
RING MALYGIN 7.0 (MISCELLANEOUS) IMPLANT
SUT ETHILON 10-0 CS-B-6CS-B-6 (SUTURE)
SUT VICRYL  9 0 (SUTURE)
SUT VICRYL 9 0 (SUTURE) IMPLANT
SUTURE EHLN 10-0 CS-B-6CS-B-6 (SUTURE) IMPLANT
SYR 3ML LL SCALE MARK (SYRINGE) ×2 IMPLANT
SYR 5ML LL (SYRINGE) ×2 IMPLANT
SYR TB 1ML LUER SLIP (SYRINGE) ×2 IMPLANT
WATER STERILE IRR 250ML POUR (IV SOLUTION) ×2 IMPLANT
WIPE NON LINTING 3.25X3.25 (MISCELLANEOUS) ×2 IMPLANT

## 2016-07-30 NOTE — Transfer of Care (Signed)
Immediate Anesthesia Transfer of Care Note  Patient: Colin Drake  Procedure(s) Performed: Procedure(s) with comments: CATARACT EXTRACTION PHACO AND INTRAOCULAR LENS PLACEMENT (IOC)  toric right (Right) - IVA TOPICAL RIGHT TORIC  Patient Location: PACU  Anesthesia Type: MAC  Level of Consciousness: awake, alert  and patient cooperative  Airway and Oxygen Therapy: Patient Spontanous Breathing and Patient connected to supplemental oxygen  Post-op Assessment: Post-op Vital signs reviewed, Patient's Cardiovascular Status Stable, Respiratory Function Stable, Patent Airway and No signs of Nausea or vomiting  Post-op Vital Signs: Reviewed and stable  Complications: No apparent anesthesia complications

## 2016-07-30 NOTE — Anesthesia Procedure Notes (Signed)
Procedure Name: MAC Date/Time: 07/30/2016 9:14 AM Performed by: Janna Arch Pre-anesthesia Checklist: Patient identified, Emergency Drugs available, Suction available and Patient being monitored Patient Re-evaluated:Patient Re-evaluated prior to inductionOxygen Delivery Method: Nasal cannula

## 2016-07-30 NOTE — H&P (Signed)
The History and Physical notes are on paper, have been signed, and are to be scanned. The patient remains stable and unchanged from the H&P.   Previous H&P reviewed, patient examined, and there are no changes.  Jency Schnieders 07/30/2016 8:22 AM

## 2016-07-30 NOTE — Anesthesia Postprocedure Evaluation (Signed)
Anesthesia Post Note  Patient: Colin Drake  Procedure(s) Performed: Procedure(s) (LRB): CATARACT EXTRACTION PHACO AND INTRAOCULAR LENS PLACEMENT (IOC)  toric right (Right)  Patient location during evaluation: PACU Anesthesia Type: MAC Level of consciousness: awake Pain management: pain level controlled Vital Signs Assessment: post-procedure vital signs reviewed and stable Respiratory status: spontaneous breathing Cardiovascular status: blood pressure returned to baseline Postop Assessment: no headache Anesthetic complications: no    Jaci Standard, III,  Carlin Attridge D

## 2016-07-30 NOTE — Op Note (Signed)
LOCATION:  [facility]   PREOPERATIVE DIAGNOSIS:  Nuclear sclerotic cataract of the right eye.  H25.11   POSTOPERATIVE DIAGNOSIS:  Nuclear sclerotic cataract of the right eye.   PROCEDURE:  Phacoemulsification with Toric posterior chamber intraocular lens placement of the right eye.   LENS:   Implant Name Type Inv. Item Serial No. Manufacturer Lot No. LRB No. Used  TECHNIS TORIC IOL Intraocular Lens     [employer] [id number] Right 1     [id number] 15.0 D Toric intraocular lens with 2.25 diopters of cylindrical power with axis orientation at 23 degrees.   ULTRASOUND TIME: 16 % of 1 minutes, 9 seconds.  CDE 10.8   SURGEON:  Wyonia Hough, MD   ANESTHESIA: Topical with tetracaine drops and 2% Xylocaine jelly, augmented with 1% preservative-free intracameral lidocaine. .   COMPLICATIONS:  None.   DESCRIPTION OF PROCEDURE:  The patient was identified in the holding room and transported to the operating suite and placed in the supine position under the operating microscope.  The right eye was identified as the operative eye, and it was prepped and draped in the usual sterile ophthalmic fashion.    A clear-corneal paracentesis incision was made at the 12:00 position.  0.5 ml of preservative-free 1% lidocaine was injected into the anterior chamber. The anterior chamber was filled with Viscoat.  A 2.4 millimeter near clear corneal incision was then made at the 9:00 position.  A cystotome and capsulorrhexis forceps were then used to make a curvilinear capsulorrhexis.  Hydrodissection and hydrodelineation were then performed using balanced salt solution.   Phacoemulsification was then used in stop and chop fashion to remove the lens, nucleus and epinucleus.  The remaining cortex was aspirated using the irrigation and aspiration handpiece.  Provisc viscoelastic was then placed into the capsular bag to distend it for lens placement.  The Verion digital marker was used to align  the implant at the intended axis.   A Toric lens was then injected into the capsular bag.  It was rotated clockwise until the axis marks on the lens were approximately 15 degrees in the counterclockwise direction to the intended alignment.  The viscoelastic was aspirated from the eye using the irrigation aspiration handpiece.  Then, a Koch spatula through the sideport incision was used to rotate the lens in a clockwise direction until the axis markings of the intraocular lens were lined up with the Verion alignment.  Balanced salt solution was then used to hydrate the wounds. Cefuroxime 0.1 ml of a 10mg /ml solution was injected into the anterior chamber for a dose of 1 mg of intracameral antibiotic at the completion of the case.    The eye was noted to have a physiologic pressure and there was no wound leak noted.   Timolol and Brimonidine drops were applied to the eye.  The patient was taken to the recovery room in stable condition having had no complications of anesthesia or surgery.  Nakota Ackert 07/30/2016, 9:38 AM

## 2016-07-30 NOTE — Anesthesia Preprocedure Evaluation (Signed)
Anesthesia Evaluation  Patient identified by MRN, date of birth, ID band Patient awake    Reviewed: Allergy & Precautions, H&P , NPO status , Patient's Chart, lab work & pertinent test results  Airway Mallampati: II  TM Distance: >3 FB Neck ROM: full    Dental no notable dental hx.    Pulmonary neg pulmonary ROS,    Pulmonary exam normal        Cardiovascular hypertension, On Medications Normal cardiovascular exam     Neuro/Psych    GI/Hepatic Neg liver ROS, Medicated,  Endo/Other  negative endocrine ROS  Renal/GU negative Renal ROS     Musculoskeletal   Abdominal   Peds  Hematology negative hematology ROS (+)   Anesthesia Other Findings   Reproductive/Obstetrics negative OB ROS                            Anesthesia Physical Anesthesia Plan  ASA: II  Anesthesia Plan: MAC   Post-op Pain Management:    Induction:   Airway Management Planned:   Additional Equipment:   Intra-op Plan:   Post-operative Plan:   Informed Consent: I have reviewed the patients History and Physical, chart, labs and discussed the procedure including the risks, benefits and alternatives for the proposed anesthesia with the patient or authorized representative who has indicated his/her understanding and acceptance.     Plan Discussed with:   Anesthesia Plan Comments:         Anesthesia Quick Evaluation  

## 2016-07-31 ENCOUNTER — Encounter: Payer: Self-pay | Admitting: Ophthalmology

## 2017-10-14 DIAGNOSIS — B029 Zoster without complications: Secondary | ICD-10-CM

## 2017-10-14 HISTORY — DX: Zoster without complications: B02.9

## 2017-10-15 DIAGNOSIS — I4719 Other supraventricular tachycardia: Secondary | ICD-10-CM

## 2017-10-15 DIAGNOSIS — I471 Supraventricular tachycardia: Secondary | ICD-10-CM

## 2017-10-15 HISTORY — DX: Supraventricular tachycardia: I47.1

## 2017-10-15 HISTORY — DX: Other supraventricular tachycardia: I47.19

## 2019-05-06 ENCOUNTER — Other Ambulatory Visit: Payer: Self-pay

## 2019-05-06 ENCOUNTER — Other Ambulatory Visit
Admission: RE | Admit: 2019-05-06 | Discharge: 2019-05-06 | Disposition: A | Discharge status: Home / Self Care | Payer: Medicare Other | Source: Ambulatory Visit | Attending: Internal Medicine | Admitting: Internal Medicine

## 2019-05-06 DIAGNOSIS — Z01812 Encounter for preprocedural laboratory examination: Secondary | ICD-10-CM | POA: Insufficient documentation

## 2019-05-06 DIAGNOSIS — Z20822 Contact with and (suspected) exposure to covid-19: Secondary | ICD-10-CM | POA: Insufficient documentation

## 2019-05-07 LAB — SARS CORONAVIRUS 2 (TAT 6-24 HRS): SARS Coronavirus 2: NEGATIVE

## 2019-05-10 ENCOUNTER — Encounter: Payer: Self-pay | Admitting: Internal Medicine

## 2019-05-11 ENCOUNTER — Ambulatory Visit: Payer: Medicare Other | Admitting: Certified Registered"

## 2019-05-11 ENCOUNTER — Ambulatory Visit
Admission: RE | Admit: 2019-05-11 | Discharge: 2019-05-11 | Disposition: A | Discharge status: Home / Self Care | Payer: Medicare Other | Attending: Internal Medicine | Admitting: Internal Medicine

## 2019-05-11 ENCOUNTER — Encounter
Admission: RE | Disposition: A | Discharge status: Home / Self Care | Payer: Self-pay | Source: Home / Self Care | Attending: Internal Medicine

## 2019-05-11 ENCOUNTER — Other Ambulatory Visit: Payer: Self-pay

## 2019-05-11 DIAGNOSIS — Z79899 Other long term (current) drug therapy: Secondary | ICD-10-CM | POA: Diagnosis not present

## 2019-05-11 DIAGNOSIS — Z86010 Personal history of colonic polyps: Secondary | ICD-10-CM | POA: Insufficient documentation

## 2019-05-11 DIAGNOSIS — Z7901 Long term (current) use of anticoagulants: Secondary | ICD-10-CM | POA: Diagnosis not present

## 2019-05-11 DIAGNOSIS — K219 Gastro-esophageal reflux disease without esophagitis: Secondary | ICD-10-CM | POA: Insufficient documentation

## 2019-05-11 DIAGNOSIS — E785 Hyperlipidemia, unspecified: Secondary | ICD-10-CM | POA: Insufficient documentation

## 2019-05-11 DIAGNOSIS — D120 Benign neoplasm of cecum: Secondary | ICD-10-CM | POA: Diagnosis not present

## 2019-05-11 DIAGNOSIS — K640 First degree hemorrhoids: Secondary | ICD-10-CM | POA: Insufficient documentation

## 2019-05-11 DIAGNOSIS — Z1211 Encounter for screening for malignant neoplasm of colon: Secondary | ICD-10-CM | POA: Insufficient documentation

## 2019-05-11 DIAGNOSIS — Z8711 Personal history of peptic ulcer disease: Secondary | ICD-10-CM | POA: Insufficient documentation

## 2019-05-11 DIAGNOSIS — R519 Headache, unspecified: Secondary | ICD-10-CM | POA: Diagnosis not present

## 2019-05-11 DIAGNOSIS — I10 Essential (primary) hypertension: Secondary | ICD-10-CM | POA: Insufficient documentation

## 2019-05-11 DIAGNOSIS — Z7951 Long term (current) use of inhaled steroids: Secondary | ICD-10-CM | POA: Insufficient documentation

## 2019-05-11 DIAGNOSIS — I479 Paroxysmal tachycardia, unspecified: Secondary | ICD-10-CM | POA: Diagnosis not present

## 2019-05-11 DIAGNOSIS — Z7982 Long term (current) use of aspirin: Secondary | ICD-10-CM | POA: Diagnosis not present

## 2019-05-11 DIAGNOSIS — K5909 Other constipation: Secondary | ICD-10-CM | POA: Insufficient documentation

## 2019-05-11 DIAGNOSIS — D123 Benign neoplasm of transverse colon: Secondary | ICD-10-CM | POA: Diagnosis not present

## 2019-05-11 DIAGNOSIS — Z885 Allergy status to narcotic agent status: Secondary | ICD-10-CM | POA: Insufficient documentation

## 2019-05-11 DIAGNOSIS — I2510 Atherosclerotic heart disease of native coronary artery without angina pectoris: Secondary | ICD-10-CM | POA: Diagnosis not present

## 2019-05-11 DIAGNOSIS — N400 Enlarged prostate without lower urinary tract symptoms: Secondary | ICD-10-CM | POA: Diagnosis not present

## 2019-05-11 DIAGNOSIS — Z881 Allergy status to other antibiotic agents status: Secondary | ICD-10-CM | POA: Insufficient documentation

## 2019-05-11 HISTORY — DX: Personal history of peptic ulcer disease: Z87.11

## 2019-05-11 HISTORY — PX: COLONOSCOPY WITH PROPOFOL: SHX5780

## 2019-05-11 HISTORY — DX: Headache, unspecified: R51.9

## 2019-05-11 HISTORY — DX: Hyperlipidemia, unspecified: E78.5

## 2019-05-11 HISTORY — DX: Atherosclerotic heart disease of native coronary artery without angina pectoris: I25.10

## 2019-05-11 SURGERY — COLONOSCOPY WITH PROPOFOL
Anesthesia: General

## 2019-05-11 MED ORDER — LIDOCAINE HCL (PF) 2 % IJ SOLN
INTRAMUSCULAR | Status: AC
  Filled 2019-05-11: qty 10

## 2019-05-11 MED ORDER — PROPOFOL 10 MG/ML IV BOLUS
INTRAVENOUS | Status: DC | PRN
  Administered 2019-05-11: 50 ug via INTRAVENOUS

## 2019-05-11 MED ORDER — PROPOFOL 10 MG/ML IV BOLUS
INTRAVENOUS | Status: AC
  Filled 2019-05-11: qty 40

## 2019-05-11 MED ORDER — PROPOFOL 500 MG/50ML IV EMUL
INTRAVENOUS | Status: DC | PRN
  Administered 2019-05-11: 150 ug/kg/min via INTRAVENOUS

## 2019-05-11 MED ORDER — SODIUM CHLORIDE 0.9 % IV SOLN
INTRAVENOUS | Status: DC
  Administered 2019-05-11: 1000 mL via INTRAVENOUS

## 2019-05-11 NOTE — Anesthesia Postprocedure Evaluation (Signed)
Anesthesia Post Note  Patient: Colin Drake  Procedure(s) Performed: COLONOSCOPY WITH PROPOFOL (N/A )  Patient location during evaluation: Endoscopy Anesthesia Type: General Level of consciousness: awake and alert Pain management: pain level controlled Vital Signs Assessment: post-procedure vital signs reviewed and stable Respiratory status: spontaneous breathing and respiratory function stable Cardiovascular status: stable Anesthetic complications: no     Last Vitals:  Vitals:   05/11/19 0821 05/11/19 0936  BP: (!) 150/78 107/69  Pulse: 66 (!) 56  Resp: 18 15  Temp: (!) 36.3 C 36.4 C  SpO2: 99% 98%    Last Pain:  Vitals:   05/11/19 0946  TempSrc:   PainSc: 0-No pain                 Melody Savidge K

## 2019-05-11 NOTE — Transfer of Care (Signed)
Immediate Anesthesia Transfer of Care Note  Patient: Colin Drake  Procedure(s) Performed: COLONOSCOPY WITH PROPOFOL (N/A )  Patient Location: Endoscopy Unit  Anesthesia Type:General  Level of Consciousness: awake, alert  and oriented  Airway & Oxygen Therapy: Patient Spontanous Breathing and Patient connected to nasal cannula oxygen  Post-op Assessment: Report given to RN and Post -op Vital signs reviewed and stable  Post vital signs: Reviewed and stable  Last Vitals:  Vitals Value Taken Time  BP    Temp    Pulse    Resp    SpO2      Last Pain:  Vitals:   05/11/19 0821  TempSrc: Tympanic         Complications: No apparent anesthesia complications

## 2019-05-11 NOTE — H&P (Signed)
Outpatient short stay form Pre-procedure 05/11/2019 9:02 AM Colin Drake K. Alice Reichert, M.D.  Primary Physician: Ezequiel Kayser, M.D.  Reason for visit:  Personal hx of tubular adenoma x 1 (Colonoscopy Oct 2015).  History of present illness:                            Patient presents for colonoscopy for a personal hx of colon polyps. The patient denies abdominal pain, abnormal weight loss or rectal bleeding. Patient has chronic constipation which he manages with Senokot.      Current Facility-Administered Medications:  .  0.9 %  sodium chloride infusion, , Intravenous, Continuous, [place], Benay Pike, MD, Last Rate: 20 mL/hr at 05/11/19 0830, 1,000 mL at 05/11/19 0830  Medications Prior to Admission  Medication Sig Dispense Refill Last Dose  . albuterol (PROVENTIL) (2.5 MG/3ML) 0.083% nebulizer solution Take 2.5 mg by nebulization every 6 (six) hours as needed for wheezing or shortness of breath.   Past Week at Unknown time  . albuterol (VENTOLIN HFA) 108 (90 Base) MCG/ACT inhaler Inhale 2 puffs into the lungs every 4 (four) hours as needed for wheezing.   Past Week at Unknown time  . apixaban (ELIQUIS) 5 MG TABS tablet Take 5 mg by mouth 2 (two) times daily.   Past Week at Unknown time  . Ascorbic Acid (VITAMIN C) 1000 MG tablet Take 1,000 mg by mouth daily.   Past Week at Unknown time  . azelastine (ASTELIN) 0.1 % nasal spray Place into both nostrils 2 (two) times daily. Use in each nostril as directed   Past Week at Unknown time  . cetirizine (ZYRTEC) 10 MG tablet Take 10 mg by mouth daily.   Past Week at Unknown time  . cholecalciferol (VITAMIN D3) 10 MCG (400 UNIT) TABS tablet Take 1,000 Units by mouth daily.   Past Week at Unknown time  . doxepin (SINEQUAN) 10 MG capsule Take 10 mg by mouth at bedtime as needed.   Past Week at Unknown time  . DPH-Lido-AlHydr-MgHydr-Simeth (FIRST-MOUTHWASH BLM) SUSP Use as directed 5 mLs in the mouth or throat every 4 (four) hours as needed.   Past Week at Unknown  time  . EPINEPHrine (EPIPEN 2-PAK) 0.3 mg/0.3 mL IJ SOAJ injection Inject 0.3 mg into the muscle as needed for anaphylaxis.   Past Week at Unknown time  . finasteride (PROSCAR) 5 MG tablet Take 5 mg by mouth daily.   Past Week at Unknown time  . fluticasone (FLONASE) 50 MCG/ACT nasal spray Place 2 sprays into both nostrils daily. Place 1-2 sprays in both nostrils daily as needed for allergies   Past Week at Unknown time  . Fluticasone-Salmeterol (ADVAIR) 250-50 MCG/DOSE AEPB Inhale 1 puff into the lungs 2 (two) times daily.   Past Week at Unknown time  . Ginger, Zingiber officinalis, (GINGER ROOT) 550 MG CAPS Take 550 mg by mouth 2 (two) times daily.   Past Week at Unknown time  . Ginkgo Biloba Extract (GNP GINGKO BILOBA EXTRACT) 60 MG CAPS Take by mouth 2 (two) times daily.   Past Week at Unknown time  . metoprolol succinate (TOPROL-XL) 25 MG 24 hr tablet Take 12.5 mg by mouth daily.   05/11/2019 at 0700  . Multiple Vitamins-Minerals (CENTRUM SILVER PO) Take by mouth daily.   Past Week at Unknown time  . Multiple Vitamins-Minerals (ICAPS AREDS 2) CAPS Take by mouth 2 (two) times daily.   Past Week at Unknown time  .  pantoprazole (PROTONIX) 40 MG tablet Take 40 mg by mouth 2 (two) times daily.   Past Week at Unknown time  . Probiotic Product (PROBIOTIC-10 PO) Take 2 capsules by mouth 2 (two) times daily.   Past Week at Unknown time  . rosuvastatin (CRESTOR) 10 MG tablet Take 10 mg by mouth daily.   Past Week at Unknown time  . tamsulosin (FLOMAX) 0.4 MG CAPS capsule Take 0.4 mg by mouth daily.   Past Week at Unknown time  . vitamin B-12 (CYANOCOBALAMIN) 1000 MCG tablet Take 1,000 mcg by mouth daily.   Past Week at Unknown time  . aspirin 81 MG tablet Take 81 mg by mouth daily.     Marland Kitchen atenolol (TENORMIN) 25 MG tablet Take by mouth daily.     . Cholecalciferol (VITAMIN D3) 2000 units TABS Take by mouth daily.        Allergies  Allergen Reactions  . Codeine Nausea Only  . Doxycycline   . Shrimp  [Shellfish Allergy]   . Vancomycin      Past Medical History:  Diagnosis Date  . Benign prostatic hyperplasia   . Coronary artery disease   . GERD (gastroesophageal reflux disease)   . Headache   . Hx of gastric ulcer    with UGI bleed due to NSAIDS  . Hx of sepsis 04/2015  . Hyperlipidemia   . Hypertension   . PAT (paroxysmal atrial tachycardia) ([place]) 10/15/2017  . Zoster 10/14/2017   right back/flank/chest    Review of systems:  Otherwise negative.    Physical Exam  Gen: Alert, oriented. Appears stated age.  HEENT: [place]/AT. PERRLA. Lungs: CTA, no wheezes. CV: RR nl S1, S2. Abd: soft, benign, no masses. BS+ Ext: No edema. Pulses 2+    Planned procedures: Proceed with colonoscopy. The patient understands the nature of the planned procedure, indications, risks, alternatives and potential complications including but not limited to bleeding, infection, perforation, damage to internal organs and possible oversedation/side effects from anesthesia. The patient agrees and gives consent to proceed.  Please refer to procedure notes for findings, recommendations and patient disposition/instructions.     Cayden Rautio K. Alice Reichert, M.D. Gastroenterology 05/11/2019  9:02 AM

## 2019-05-11 NOTE — Op Note (Signed)
[facility] Gastroenterology Patient Name: Colin Drake Procedure Date: 05/11/2019 9:08 AM MRN: [id number] Account #: [id number] Date of Birth: [birth year] Admit Type: Outpatient Age: 79 Room: [facility] ENDO ROOM 3 Gender: Male Note Status: Finalized Procedure:             Colonoscopy Indications:           Surveillance: Personal history of adenomatous polyps                         on last colonoscopy > 5 years ago Providers:             Lorie Apley K. Alice Reichert MD, MD Referring MD:          Christena Flake. Raechel Ache, MD (Referring MD) Medicines:             Propofol per Anesthesia Complications:         No immediate complications. Estimated blood loss:                         Minimal. Procedure:             Pre-Anesthesia Assessment:                        - The risks and benefits of the procedure and the                         sedation options and risks were discussed with the                         patient. All questions were answered and informed                         consent was obtained.                        - Patient identification and proposed procedure were                         verified prior to the procedure by the nurse. The                         procedure was verified in the procedure room.                        - ASA Grade Assessment: III - A patient with severe                         systemic disease.                        - After reviewing the risks and benefits, the patient                         was deemed in satisfactory condition to undergo the                         procedure.                        After obtaining informed consent, the colonoscope  was                         passed under direct vision. Throughout the procedure,                         the patient's blood pressure, pulse, and oxygen                         saturations were monitored continuously. The                         Colonoscope was introduced through the anus and                  advanced to the the cecum, identified by appendiceal                         orifice and ileocecal valve. The colonoscopy was                         performed without difficulty. The patient tolerated                         the procedure well. The quality of the bowel                         preparation was adequate. The ileocecal valve,                         appendiceal orifice, and rectum were photographed. Findings:      The perianal and digital rectal examinations were normal. Pertinent       negatives include normal sphincter tone and no palpable rectal lesions.      Non-bleeding internal hemorrhoids were found during retroflexion. The       hemorrhoids were Grade I (internal hemorrhoids that do not prolapse).      Three sessile polyps were found in the ileocecal valve. The polyps were       3 to 5 mm in size. These polyps were removed with a jumbo cold forceps.       Resection and retrieval were complete.      A 7 mm polyp was found in the ileocecal valve. The polyp was sessile.       The polyp was removed with a cold snare. Resection and retrieval were       complete.      Five sessile polyps were found in the transverse colon. The polyps were       4 to 7 mm in size. These polyps were removed with a cold snare.       Resection and retrieval were complete.      The exam was otherwise without abnormality on direct and retroflexion       views. Impression:            - Non-bleeding internal hemorrhoids.                        - Three 3 to 5 mm polyps at the ileocecal valve,                         removed with  a jumbo cold forceps. Resected and                         retrieved.                        - One 7 mm polyp at the ileocecal valve, removed with                         a cold snare. Resected and retrieved.                        - Five 4 to 7 mm polyps in the transverse colon,                         removed with a cold snare. Resected and retrieved.                         - The examination was otherwise normal on direct and                         retroflexion views. Recommendation:        - Patient has a contact number available for                         emergencies. The signs and symptoms of potential                         delayed complications were discussed with the patient.                         Return to normal activities tomorrow. Written                         discharge instructions were provided to the patient.                        - Resume previous diet.                        - Continue present medications.                        - If polyps are benign or adenomatous without                         dysplasia, I will advise NO further colonoscopy due to                         advanced age and/or severe comorbidity.                        - Return to GI office PRN.                        - The findings and recommendations were discussed with                         the patient. Procedure Code(s):     --- Professional ---  45385, Colonoscopy, flexible; with removal of                         tumor(s), polyp(s), or other lesion(s) by snare                         technique                        45380, 59, Colonoscopy, flexible; with biopsy, single                         or multiple Diagnosis Code(s):     --- Professional ---                        K64.0, First degree hemorrhoids                        Z86.010, Personal history of colonic polyps                        K63.5, Polyp of colon CPT copyright 2019 American Medical Association. All rights reserved. The codes documented in this report are preliminary and upon coder review may  be revised to meet current compliance requirements. Efrain Sella MD, MD 05/11/2019 9:34:26 AM This report has been signed electronically. Number of Addenda: 0 Note Initiated On: 05/11/2019 9:08 AM Scope Withdrawal Time: 0 hours 11 minutes 25 seconds  Total  Procedure Duration: 0 hours 14 minutes 39 seconds  Estimated Blood Loss:  Estimated blood loss was minimal.      [facility]

## 2019-05-11 NOTE — Interval H&P Note (Signed)
History and Physical Interval Note:  05/11/2019 9:03 AM  Colin Drake  has presented today for surgery, with the diagnosis of PERSONAL HX.OF COLON POLYPS.  The various methods of treatment have been discussed with the patient and family. After consideration of risks, benefits and other options for treatment, the patient has consented to  Procedure(s): COLONOSCOPY WITH PROPOFOL (N/A) as a surgical intervention.  The patient's history has been reviewed, patient examined, no change in status, stable for surgery.  I have reviewed the patient's chart and labs.  Questions were answered to the patient's satisfaction.     [name], [place]

## 2019-05-11 NOTE — Anesthesia Preprocedure Evaluation (Signed)
Anesthesia Evaluation  Patient identified by MRN, date of birth, ID band Patient awake    Reviewed: Allergy & Precautions, NPO status , Patient's Chart, lab work & pertinent test results  History of Anesthesia Complications Negative for: history of anesthetic complications  Airway Mallampati: II       Dental   Pulmonary neg sleep apnea, neg COPD, Not current smoker,           Cardiovascular hypertension, Pt. on medications (-) Past MI and (-) CHF + dysrhythmias (1 episode of afib, cardioverted no problems since) Atrial Fibrillation (-) Valvular Problems/Murmurs     Neuro/Psych neg Seizures    GI/Hepatic Neg liver ROS, neg GERD  ,  Endo/Other  neg diabetes  Renal/GU negative Renal ROS     Musculoskeletal   Abdominal   Peds  Hematology   Anesthesia Other Findings   Reproductive/Obstetrics                             Anesthesia Physical Anesthesia Plan  ASA: III  Anesthesia Plan: General   Post-op Pain Management:    Induction: Intravenous  PONV Risk Score and Plan: 2 and Propofol infusion and TIVA  Airway Management Planned: Nasal Cannula  Additional Equipment:   Intra-op Plan:   Post-operative Plan:   Informed Consent: I have reviewed the patients History and Physical, chart, labs and discussed the procedure including the risks, benefits and alternatives for the proposed anesthesia with the patient or authorized representative who has indicated his/her understanding and acceptance.       Plan Discussed with:   Anesthesia Plan Comments:         Anesthesia Quick Evaluation

## 2019-05-12 ENCOUNTER — Encounter: Payer: Self-pay | Admitting: *Deleted

## 2019-05-12 LAB — SURGICAL PATHOLOGY

## 2019-06-01 ENCOUNTER — Other Ambulatory Visit: Payer: Self-pay | Admitting: Orthopedic Surgery

## 2019-06-01 DIAGNOSIS — M4807 Spinal stenosis, lumbosacral region: Secondary | ICD-10-CM

## 2019-06-13 ENCOUNTER — Ambulatory Visit
Admission: RE | Admit: 2019-06-13 | Discharge: 2019-06-13 | Disposition: A | Discharge status: Home / Self Care | Payer: Medicare Other | Source: Ambulatory Visit | Attending: Orthopedic Surgery | Admitting: Orthopedic Surgery

## 2019-06-13 ENCOUNTER — Other Ambulatory Visit: Payer: Self-pay

## 2019-06-13 DIAGNOSIS — M4807 Spinal stenosis, lumbosacral region: Secondary | ICD-10-CM | POA: Insufficient documentation

## 2019-07-05 ENCOUNTER — Other Ambulatory Visit: Payer: Self-pay | Admitting: Orthopedic Surgery

## 2019-07-05 DIAGNOSIS — M25312 Other instability, left shoulder: Secondary | ICD-10-CM

## 2019-07-05 DIAGNOSIS — M25512 Pain in left shoulder: Secondary | ICD-10-CM

## 2019-07-05 DIAGNOSIS — S46002A Unspecified injury of muscle(s) and tendon(s) of the rotator cuff of left shoulder, initial encounter: Secondary | ICD-10-CM

## 2019-07-18 ENCOUNTER — Ambulatory Visit
Admission: RE | Admit: 2019-07-18 | Discharge: 2019-07-18 | Disposition: A | Discharge status: Home / Self Care | Payer: Medicare Other | Source: Ambulatory Visit | Attending: Orthopedic Surgery | Admitting: Orthopedic Surgery

## 2019-07-18 ENCOUNTER — Other Ambulatory Visit: Payer: Self-pay

## 2019-07-18 DIAGNOSIS — S46002A Unspecified injury of muscle(s) and tendon(s) of the rotator cuff of left shoulder, initial encounter: Secondary | ICD-10-CM

## 2019-07-18 DIAGNOSIS — M25312 Other instability, left shoulder: Secondary | ICD-10-CM | POA: Diagnosis present

## 2019-07-18 DIAGNOSIS — M25512 Pain in left shoulder: Secondary | ICD-10-CM | POA: Insufficient documentation

## 2021-05-07 ENCOUNTER — Encounter: Payer: Self-pay | Admitting: Internal Medicine

## 2021-05-08 ENCOUNTER — Ambulatory Visit: Payer: Medicare Other | Admitting: Anesthesiology

## 2021-05-08 ENCOUNTER — Ambulatory Visit
Admission: RE | Admit: 2021-05-08 | Discharge: 2021-05-08 | Disposition: A | Discharge status: Home / Self Care | Payer: Medicare Other | Attending: Internal Medicine | Admitting: Internal Medicine

## 2021-05-08 ENCOUNTER — Encounter
Admission: RE | Disposition: A | Discharge status: Home / Self Care | Payer: Self-pay | Source: Home / Self Care | Attending: Internal Medicine

## 2021-05-08 DIAGNOSIS — K219 Gastro-esophageal reflux disease without esophagitis: Secondary | ICD-10-CM | POA: Insufficient documentation

## 2021-05-08 DIAGNOSIS — K257 Chronic gastric ulcer without hemorrhage or perforation: Secondary | ICD-10-CM | POA: Insufficient documentation

## 2021-05-08 DIAGNOSIS — Z7901 Long term (current) use of anticoagulants: Secondary | ICD-10-CM | POA: Insufficient documentation

## 2021-05-08 DIAGNOSIS — I10 Essential (primary) hypertension: Secondary | ICD-10-CM | POA: Insufficient documentation

## 2021-05-08 DIAGNOSIS — K267 Chronic duodenal ulcer without hemorrhage or perforation: Secondary | ICD-10-CM | POA: Insufficient documentation

## 2021-05-08 DIAGNOSIS — K3189 Other diseases of stomach and duodenum: Secondary | ICD-10-CM | POA: Insufficient documentation

## 2021-05-08 DIAGNOSIS — Z79899 Other long term (current) drug therapy: Secondary | ICD-10-CM | POA: Insufficient documentation

## 2021-05-08 DIAGNOSIS — Z951 Presence of aortocoronary bypass graft: Secondary | ICD-10-CM | POA: Insufficient documentation

## 2021-05-08 DIAGNOSIS — K2950 Unspecified chronic gastritis without bleeding: Secondary | ICD-10-CM | POA: Insufficient documentation

## 2021-05-08 DIAGNOSIS — Z7951 Long term (current) use of inhaled steroids: Secondary | ICD-10-CM | POA: Diagnosis not present

## 2021-05-08 DIAGNOSIS — J449 Chronic obstructive pulmonary disease, unspecified: Secondary | ICD-10-CM | POA: Insufficient documentation

## 2021-05-08 DIAGNOSIS — I2510 Atherosclerotic heart disease of native coronary artery without angina pectoris: Secondary | ICD-10-CM | POA: Diagnosis not present

## 2021-05-08 HISTORY — PX: ESOPHAGOGASTRODUODENOSCOPY: SHX5428

## 2021-05-08 SURGERY — EGD (ESOPHAGOGASTRODUODENOSCOPY)
Anesthesia: General

## 2021-05-08 MED ORDER — PROPOFOL 500 MG/50ML IV EMUL
INTRAVENOUS | Status: AC
  Filled 2021-05-08: qty 50

## 2021-05-08 MED ORDER — PROPOFOL 10 MG/ML IV BOLUS
INTRAVENOUS | Status: DC | PRN
  Administered 2021-05-08: 70 mg via INTRAVENOUS

## 2021-05-08 MED ORDER — PROPOFOL 500 MG/50ML IV EMUL
INTRAVENOUS | Status: DC | PRN
  Administered 2021-05-08: 125 ug/kg/min via INTRAVENOUS

## 2021-05-08 MED ORDER — LIDOCAINE HCL (CARDIAC) PF 100 MG/5ML IV SOSY
PREFILLED_SYRINGE | INTRAVENOUS | Status: DC | PRN
  Administered 2021-05-08: 50 mg via INTRAVENOUS

## 2021-05-08 MED ORDER — LIDOCAINE HCL (PF) 2 % IJ SOLN
INTRAMUSCULAR | Status: AC
  Filled 2021-05-08: qty 5

## 2021-05-08 MED ORDER — SODIUM CHLORIDE 0.9 % IV SOLN
INTRAVENOUS | Status: DC
  Administered 2021-05-08: 20 mL/h via INTRAVENOUS

## 2021-05-08 NOTE — Interval H&P Note (Signed)
History and Physical Interval Note:  05/08/2021 8:42 AM  Colin Drake  has presented today for surgery, with the diagnosis of DUODENAL ULCERS.  The various methods of treatment have been discussed with the patient and family. After consideration of risks, benefits and other options for treatment, the patient has consented to  Procedure(s): ESOPHAGOGASTRODUODENOSCOPY (EGD) (N/A) as a surgical intervention.  The patient's history has been reviewed, patient examined, no change in status, stable for surgery.  I have reviewed the patient's chart and labs.  Questions were answered to the patient's satisfaction.     [place], [place]

## 2021-05-08 NOTE — Anesthesia Preprocedure Evaluation (Signed)
Anesthesia Evaluation  Patient identified by MRN, date of birth, ID band Patient awake    Reviewed: Allergy & Precautions, NPO status , Patient's Chart, lab work & pertinent test results  History of Anesthesia Complications Negative for: history of anesthetic complications  Airway Mallampati: III   Neck ROM: Full    Dental no notable dental hx.    Pulmonary COPD,    Pulmonary exam normal breath sounds clear to auscultation       Cardiovascular hypertension, + CAD (s/p PTCA)  Normal cardiovascular exam+ dysrhythmias (a fib s/p cardioversion)  Rhythm:Regular Rate:Normal     Neuro/Psych  Headaches,    GI/Hepatic PUD, GERD  ,  Endo/Other  negative endocrine ROS  Renal/GU negative Renal ROS   BPH    Musculoskeletal   Abdominal   Peds  Hematology negative hematology ROS (+)   Anesthesia Other Findings Reviewed 12/11/20 cardiology note.  Reproductive/Obstetrics                             Anesthesia Physical Anesthesia Plan  ASA: 3  Anesthesia Plan: General   Post-op Pain Management:    Induction: Intravenous  PONV Risk Score and Plan: 2 and Propofol infusion, TIVA and Treatment may vary due to age or medical condition  Airway Management Planned: Natural Airway  Additional Equipment:   Intra-op Plan:   Post-operative Plan:   Informed Consent: I have reviewed the patients History and Physical, chart, labs and discussed the procedure including the risks, benefits and alternatives for the proposed anesthesia with the patient or authorized representative who has indicated his/her understanding and acceptance.       Plan Discussed with: CRNA  Anesthesia Plan Comments: (LMA/GETA backup discussed.  Patient consented for risks of anesthesia including but not limited to:  - adverse reactions to medications - damage to eyes, teeth, lips or other oral mucosa - nerve damage due to  positioning  - sore throat or hoarseness - damage to heart, brain, nerves, lungs, other parts of body or loss of life  Informed patient about role of CRNA in peri- and intra-operative care.  Patient voiced understanding.)        Anesthesia Quick Evaluation

## 2021-05-08 NOTE — Anesthesia Postprocedure Evaluation (Signed)
Anesthesia Post Note  Patient: Colin Drake  Procedure(s) Performed: ESOPHAGOGASTRODUODENOSCOPY (EGD)  Patient location during evaluation: PACU Anesthesia Type: General Level of consciousness: awake and alert, oriented and patient cooperative Pain management: pain level controlled Vital Signs Assessment: post-procedure vital signs reviewed and stable Respiratory status: spontaneous breathing, nonlabored ventilation and respiratory function stable Cardiovascular status: blood pressure returned to baseline and stable Postop Assessment: adequate PO intake Anesthetic complications: no   No notable events documented.   Last Vitals:  Vitals:   05/08/21 0930 05/08/21 0940  BP: 129/70 (!) 141/68  Pulse: (!) 58   Resp: 19   Temp: (!) 36.3 C   SpO2: 98%     Last Pain:  Vitals:   05/08/21 0950  TempSrc:   PainSc: 0-No pain                 Darrin Nipper

## 2021-05-08 NOTE — H&P (Signed)
Outpatient short stay form Pre-procedure 05/08/2021 8:31 AM Colin Drake K. Alice Reichert, M.D.  Primary Physician: Junie Panning, D.O.  Reason for visit:  Personal history of gastric ulcer, duodenal ulcer  History of present illness:  EGD: 7/72022 - [facility] - Small cratered ulcer x 2 (one in antrum and one in duodenal bulb, non-bleeding). Patient presented with melena ON Eliquis, which was discontinued by [facility]. Patient denies dysphagia, abdominal pain, hemetemesis, weight loss, anorexia or melena.      Current Facility-Administered Medications:    0.9 %  sodium chloride infusion, , Intravenous, Continuous, Aryianna Earwood, Benay Pike, MD  Medications Prior to Admission  Medication Sig Dispense Refill Last Dose   albuterol (PROVENTIL) (2.5 MG/3ML) 0.083% nebulizer solution Take 2.5 mg by nebulization every 6 (six) hours as needed for wheezing or shortness of breath.      albuterol (VENTOLIN HFA) 108 (90 Base) MCG/ACT inhaler Inhale 2 puffs into the lungs every 4 (four) hours as needed for wheezing.      apixaban (ELIQUIS) 5 MG TABS tablet Take 5 mg by mouth 2 (two) times daily.      Ascorbic Acid (VITAMIN C) 1000 MG tablet Take 1,000 mg by mouth daily.      aspirin 81 MG tablet Take 81 mg by mouth daily.      atenolol (TENORMIN) 25 MG tablet Take by mouth daily.      azelastine (ASTELIN) 0.1 % nasal spray Place into both nostrils 2 (two) times daily. Use in each nostril as directed      cetirizine (ZYRTEC) 10 MG tablet Take 10 mg by mouth daily.      cholecalciferol (VITAMIN D3) 10 MCG (400 UNIT) TABS tablet Take 1,000 Units by mouth daily.      Cholecalciferol (VITAMIN D3) 2000 units TABS Take by mouth daily.      doxepin (SINEQUAN) 10 MG capsule Take 10 mg by mouth at bedtime as needed.      DPH-Lido-AlHydr-MgHydr-Simeth (FIRST-MOUTHWASH BLM) SUSP Use as directed 5 mLs in the mouth or throat every 4 (four) hours as needed.      EPINEPHrine (EPIPEN 2-PAK) 0.3 mg/0.3 mL IJ SOAJ injection Inject 0.3  mg into the muscle as needed for anaphylaxis.      finasteride (PROSCAR) 5 MG tablet Take 5 mg by mouth daily.      fluticasone (FLONASE) 50 MCG/ACT nasal spray Place 2 sprays into both nostrils daily. Place 1-2 sprays in both nostrils daily as needed for allergies      Fluticasone-Salmeterol (ADVAIR) 250-50 MCG/DOSE AEPB Inhale 1 puff into the lungs 2 (two) times daily.      Ginger, Zingiber officinalis, (GINGER ROOT) 550 MG CAPS Take 550 mg by mouth 2 (two) times daily.      Ginkgo Biloba Extract (GNP GINGKO BILOBA EXTRACT) 60 MG CAPS Take by mouth 2 (two) times daily.      metoprolol succinate (TOPROL-XL) 25 MG 24 hr tablet Take 12.5 mg by mouth daily.      Multiple Vitamins-Minerals (CENTRUM SILVER PO) Take by mouth daily.      Multiple Vitamins-Minerals (ICAPS AREDS 2) CAPS Take by mouth 2 (two) times daily.      pantoprazole (PROTONIX) 40 MG tablet Take 40 mg by mouth 2 (two) times daily.      Probiotic Product (PROBIOTIC-10 PO) Take 2 capsules by mouth 2 (two) times daily.      rosuvastatin (CRESTOR) 10 MG tablet Take 10 mg by mouth daily.      tamsulosin (FLOMAX) 0.4  MG CAPS capsule Take 0.4 mg by mouth daily.      vitamin B-12 (CYANOCOBALAMIN) 1000 MCG tablet Take 1,000 mcg by mouth daily.        Allergies  Allergen Reactions   Codeine Nausea Only   Doxycycline    Shrimp [Shellfish Allergy]    Vancomycin      Past Medical History:  Diagnosis Date   Benign prostatic hyperplasia    Coronary artery disease    GERD (gastroesophageal reflux disease)    Headache    Hx of gastric ulcer    with UGI bleed due to NSAIDS   Hx of sepsis 04/2015   Hyperlipidemia    Hypertension    PAT (paroxysmal atrial tachycardia) ([place]) 10/15/2017   Zoster 10/14/2017   right back/flank/chest    Review of systems:  Otherwise negative.    Physical Exam  Gen: Alert, oriented. Appears stated age.  HEENT: [place]/AT. PERRLA. Lungs: CTA, no wheezes. CV: RR nl S1, S2. Abd: soft, benign, no  masses. BS+ Ext: No edema. Pulses 2+    Planned procedures: Proceed with EGD. The patient understands the nature of the planned procedure, indications, risks, alternatives and potential complications including but not limited to bleeding, infection, perforation, damage to internal organs and possible oversedation/side effects from anesthesia. The patient agrees and gives consent to proceed.  Please refer to procedure notes for findings, recommendations and patient disposition/instructions.     Colin Drake K. Alice Reichert, M.D. Gastroenterology 05/08/2021  8:31 AM

## 2021-05-08 NOTE — Op Note (Signed)
[facility] Gastroenterology Patient Name: Colin Drake Procedure Date: 05/08/2021 9:09 AM MRN: [id number] Account #: [id number] Date of Birth: 08/02/1940 Admit Type: Outpatient Age: 81 Room: [facility] ENDO ROOM 2 Gender: Male Note Status: Finalized Instrument Name: Upper Endoscope [id number] Procedure:             Upper GI endoscopy Indications:           Follow-up of chronic gastric ulcer, Follow-up of                         chronic duodenal ulcer Providers:             Lorie Apley K. Alice Reichert MD, MD Referring MD:          No Local Md, MD (Referring MD) Medicines:             Propofol per Anesthesia Complications:         No immediate complications. Procedure:             Pre-Anesthesia Assessment:                        - The risks and benefits of the procedure and the                         sedation options and risks were discussed with the                         patient. All questions were answered and informed                         consent was obtained.                        - Patient identification and proposed procedure were                         verified prior to the procedure by the nurse. The                         procedure was verified in the procedure room.                        - ASA Grade Assessment: III - A patient with severe                         systemic disease.                        - After reviewing the risks and benefits, the patient                         was deemed in satisfactory condition to undergo the                         procedure.                        After obtaining informed consent, the endoscope was  passed under direct vision. Throughout the procedure,                         the patient's blood pressure, pulse, and oxygen                         saturations were monitored continuously. The Endoscope                         was introduced through the mouth, and advanced to the                          third part of duodenum. The upper GI endoscopy was                         accomplished without difficulty. The patient tolerated                         the procedure well. Findings:      The Z-line was irregular and was found 36 to 37 cm from the incisors.       Mucosa was biopsied with a cold forceps for histology. One specimen       bottle was sent to pathology.      No other significant abnormalities were identified in a careful       examination of the esophagus.      Diffuse mild inflammation characterized by congestion (edema) and       erythema was found in the gastric body and in the gastric antrum. Two       biopsies were obtained with cold forceps for Helicobacter pylori testing       in the gastric body.      The cardia and gastric fundus were normal on retroflexion.      A 8 mm healed ulcer was found in the prepyloric region of the stomach.       The scar was unremarkable in appearance.      The examined duodenum was normal.      The exam was otherwise without abnormality. Impression:            - Z-line irregular, 36 to 37 cm from the incisors.                         Biopsied.                        - Gastritis.                        - Scar in the prepyloric region of the stomach.                        - Normal examined duodenum.                        - The examination was otherwise normal.                        - Biopsies performed in the gastric body. Recommendation:        - Patient has a contact number available for  emergencies. The signs and symptoms of potential                         delayed complications were discussed with the patient.                         Return to normal activities tomorrow. Written                         discharge instructions were provided to the patient.                        - Resume previous diet.                        - Continue present medications.                        - May taper PPI  (Pantoprazole or Omeprazole) to one                         pill daily for 1 month, then may take as needed.                        Resume taking Pantoprazole every day IF                         anticoagulation (Eliquis, Plavix, etc.) is resumed in                         the future.                        - Return to my office PRN.                        - The findings and recommendations were discussed with                         the patient. 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 All rights reserved. The codes documented in this report are preliminary and upon coder review may  be revised to meet current compliance requirements. Efrain Sella MD, MD 05/08/2021 9:34:52 AM This report has been signed electronically. Number of Addenda: 0 Note Initiated On: 05/08/2021 9:09 AM Estimated Blood Loss:  Estimated blood loss: none.      [facility]

## 2021-05-08 NOTE — Transfer of Care (Signed)
Immediate Anesthesia Transfer of Care Note  Patient: Colin Drake  Procedure(s) Performed: ESOPHAGOGASTRODUODENOSCOPY (EGD)  Patient Location: PACU  Anesthesia Type:General  Level of Consciousness: awake and drowsy  Airway & Oxygen Therapy: Patient Spontanous Breathing  Post-op Assessment: Report given to RN and Post -op Vital signs reviewed and stable  Post vital signs: Reviewed and stable  Last Vitals:  Vitals Value Taken Time  BP    Temp    Pulse 58 05/08/21 0930  Resp 19 05/08/21 0930  SpO2 98 % 05/08/21 0930    Last Pain:  Vitals:   05/08/21 0833  TempSrc: Temporal  PainSc: 0-No pain         Complications: No notable events documented.

## 2021-05-08 NOTE — Interval H&P Note (Signed)
History and Physical Interval Note:  05/08/2021 8:32 AM  Colin Drake  has presented today for surgery, with the diagnosis of DUODENAL ULCERS.  The various methods of treatment have been discussed with the patient and family. After consideration of risks, benefits and other options for treatment, the patient has consented to  Procedure(s): ESOPHAGOGASTRODUODENOSCOPY (EGD) (N/A) as a surgical intervention.  The patient's history has been reviewed, patient examined, no change in status, stable for surgery.  I have reviewed the patient's chart and labs.  Questions were answered to the patient's satisfaction.     [place], Rodri­g[place]

## 2021-05-09 ENCOUNTER — Encounter: Payer: Self-pay | Admitting: Internal Medicine

## 2021-05-09 LAB — SURGICAL PATHOLOGY

## 2021-12-06 IMAGING — MR MR LUMBAR SPINE W/O CM
5 series · 30 of 48 positions shown · non-contrast
Comparison: None.

CLINICAL DATA: Low back pain radiating to right leg.

EXAM:
MRI LUMBAR SPINE WITHOUT CONTRAST
TECHNIQUE: Multiplanar, multisequence MR imaging of the lumbar spine was
performed. No intravenous contrast was administered.

[Series 5: T2 · sagittal · 4.0mm · 0.81mm/px · 6 of 17 slices shown (1 of 2)]
[im 1/17]
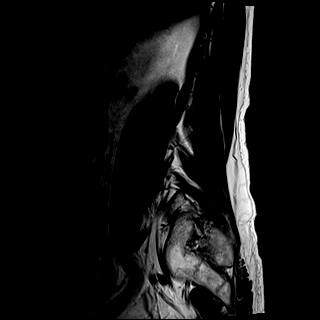
[im 4/17]
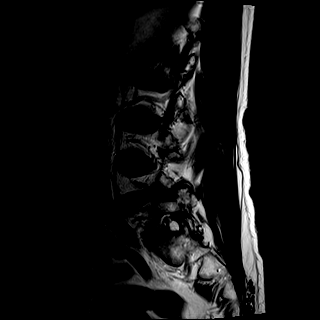
[im 7/17]
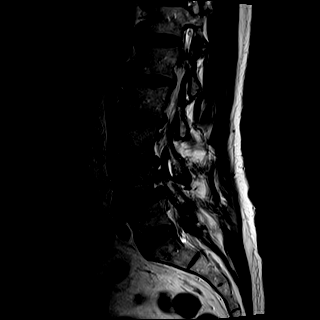
[im 10/17]
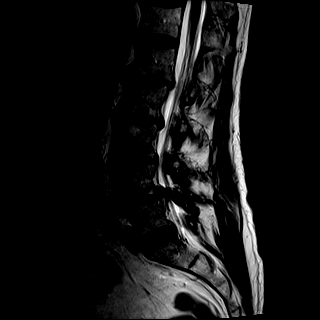
[im 13/17]
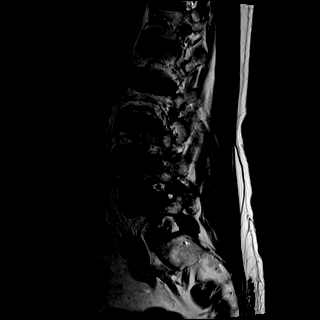
[im 17/17]
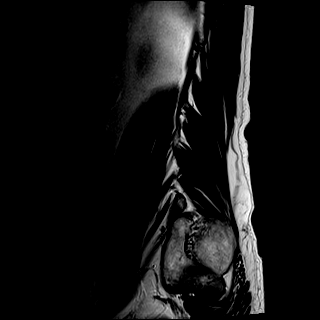

[Series 6: T1 · sagittal · 4.0mm · 0.81mm/px · 7 of 17 slices shown (1 of 2)]
[im 1/17]
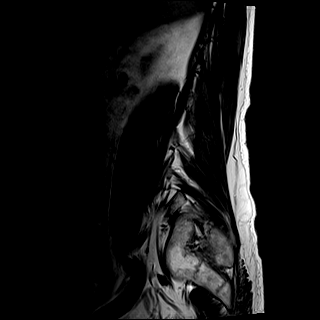
[im 3/17]
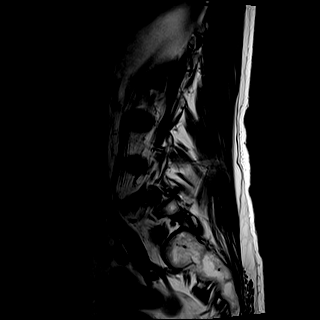
[im 6/17]
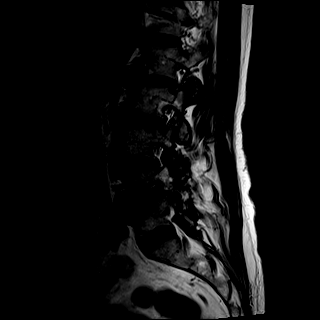
[im 9/17]
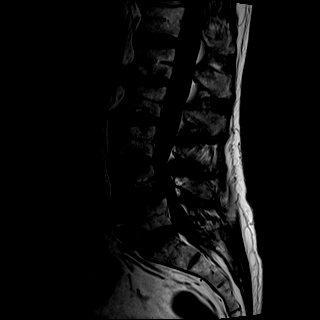
[im 11/17]
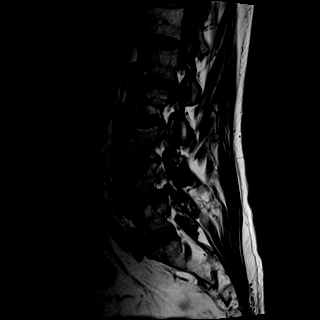
[im 14/17]
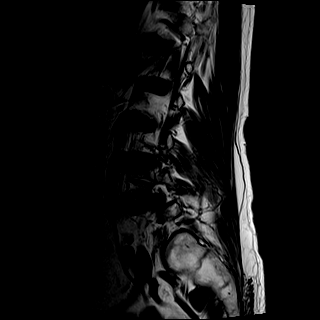
[im 17/17]
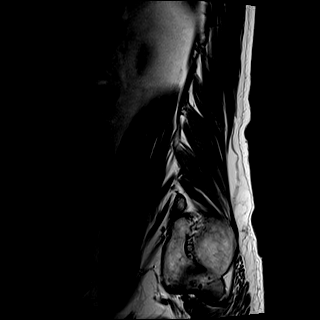

[Series 7: STIR · sagittal · 4.0mm · 0.41mm/px · 1 of 17 slices shown]
[im 1/17]
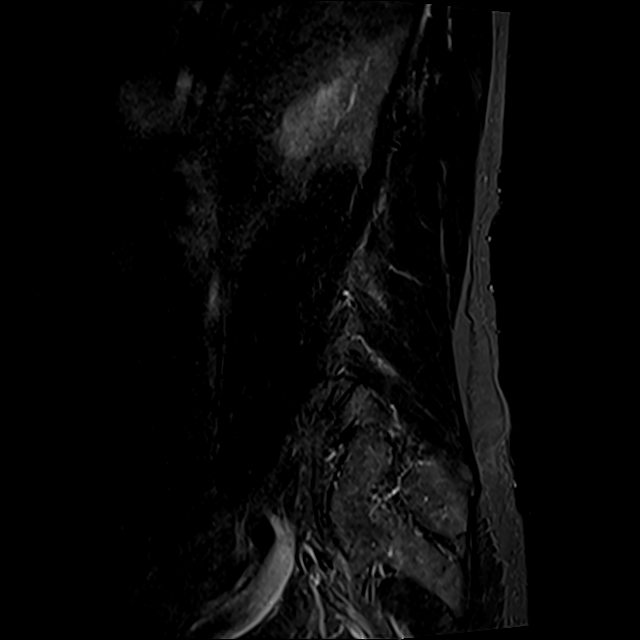

[Series 8: T2 · axial · 4.0mm · 0.78mm/px · z∈[-75,+130]mm · 8 of 36 slices shown (2 of 2)]
[im 1/36]
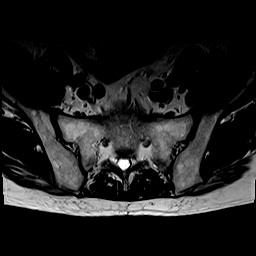
[im 6/36]
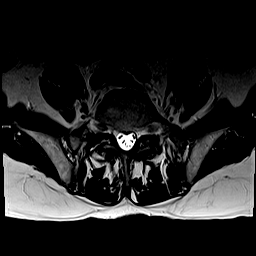
[im 11/36]
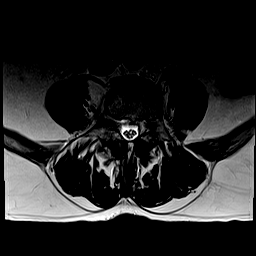
[im 17/36]
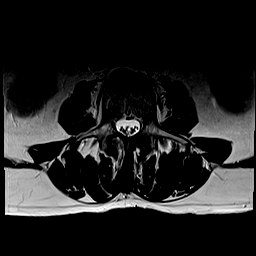
[im 19/36]
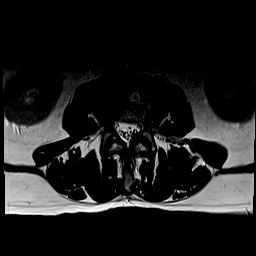
[im 25/36]
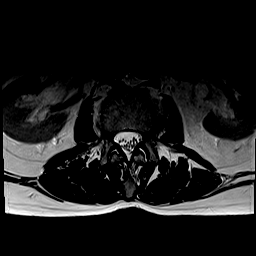
[im 30/36]
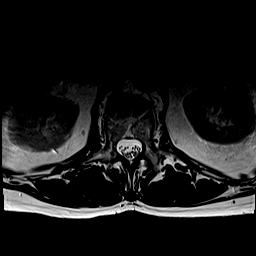
[im 36/36]
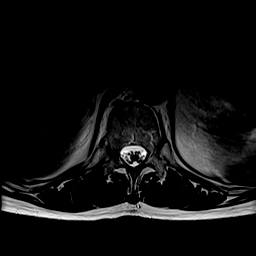

[Series 9: T1 · axial · 4.0mm · 0.39mm/px · z∈[-75,+130]mm · 8 of 36 slices shown (2 of 2)]
[im 1/36]
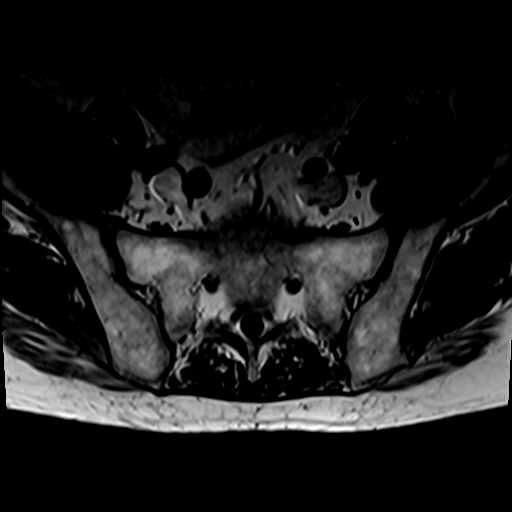
[im 6/36]
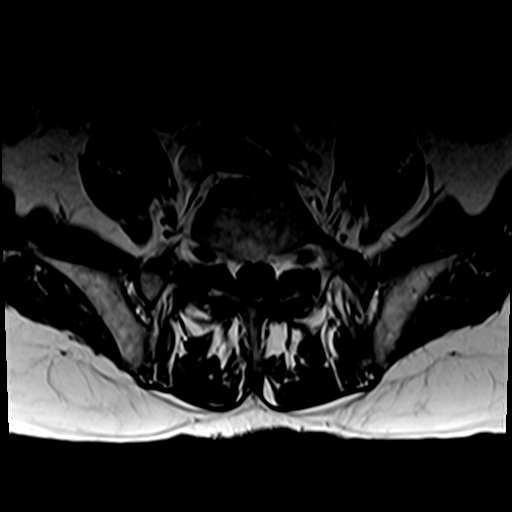
[im 11/36]
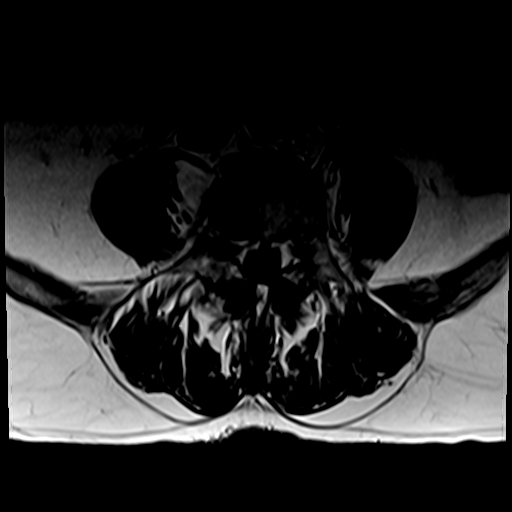
[im 17/36]
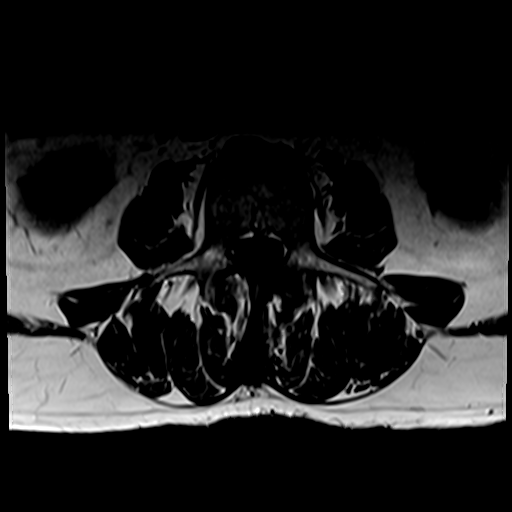
[im 19/36]
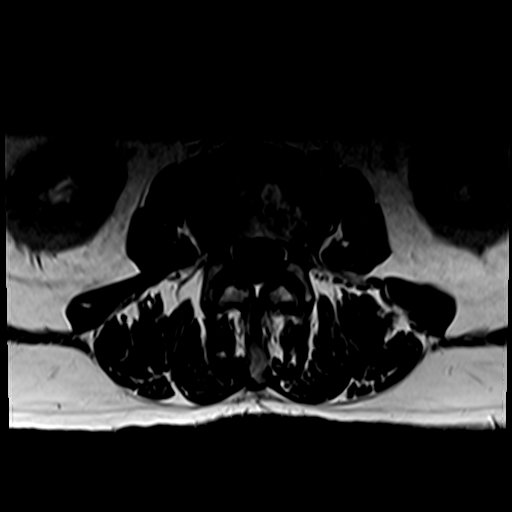
[im 25/36]
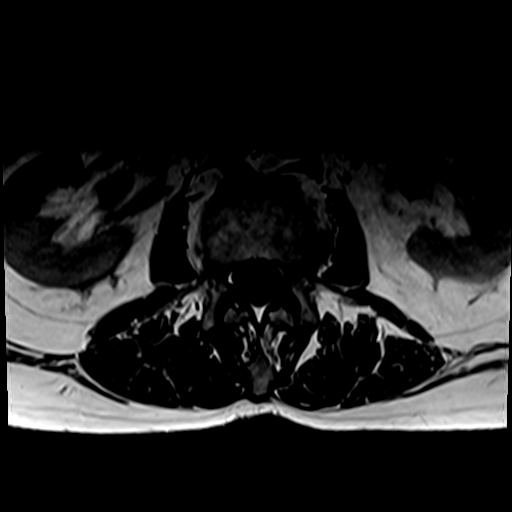
[im 30/36]
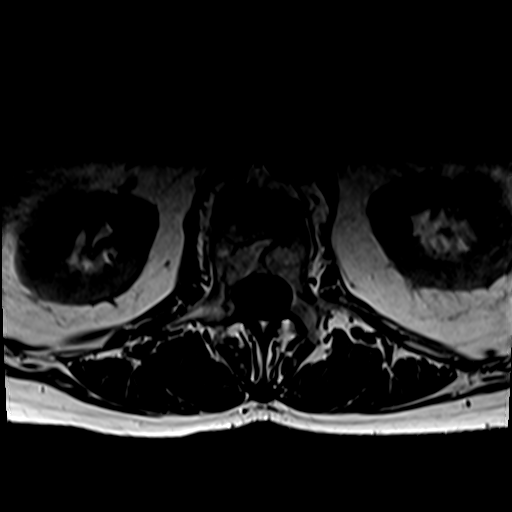
[im 36/36]
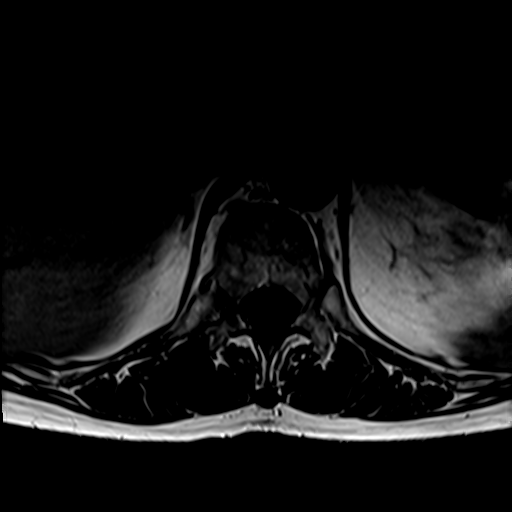

[30 of 48 positions shown; findings below may reference images not displayed]

FINDINGS: Segmentation:  Standard.

Alignment: There is a grade 1 anterolisthesis of L4 over L5 related
to prominent degenerative facet disease.

Vertebrae: Endplate degenerative changes are seen at L4-5. No
fracture, evidence of discitis, or bone lesion.

Conus medullaris and cauda equina: Conus extends to the L1 level.
Conus appear normal. Redundancy of the roots of the cauda equina
from L2 through L4 is related to L4-5 severe spinal canal stenosis.

Paraspinal and other soft tissues: Negative.

Disc levels:

T12-L1: No spinal canal or neural foraminal stenosis.

L1-2: Loss of disc height, left asymmetric disc bulge and mild facet
degenerative changes resulting in narrowing of the left subarticular
zone and mild left neural foraminal narrowing.

L2-3: Disc bulge and mild facet degenerative changes resulting in
mild-to-moderate right and mild left neural foraminal narrowing. No
significant spinal canal stenosis.

L3-4: Disc bulge, moderate facet degenerative changes and ligamentum
flavum redundancy resulting in moderate spinal canal stenosis, mild
right and moderate left neural foraminal narrowing.

L4-5: Loss of disc height, disc bulge/uncovering of the disc with
prominent facet degenerative changes and ligamentum flavum
redundancy resulting in severe spinal canal stenosis, severe right
and moderate left neural foraminal narrowing.

L5-S1: Tiny central disc protrusion and moderate facet degenerative
changes resulting in mild left neural foraminal narrowing. No
significant spinal canal stenosis.
IMPRESSION: 1. Multilevel degenerative changes of the lumbar spine as described
above.
2. Grade 1 anterolisthesis of L4 over L5 related to advanced facet
degenerative changes which in association with degenerative disc
disease results in severe spinal canal stenosis. There is also
severe right and moderate left neural foraminal narrowing at this
level.
3. Moderate spinal canal stenosis at L3-4 with moderate left neural
foraminal narrowing.
4. Mild to moderate right neural foraminal narrowing at L2-3 and
L5-S1.

## 2022-06-19 ENCOUNTER — Other Ambulatory Visit: Payer: Self-pay | Admitting: Physical Medicine & Rehabilitation

## 2022-06-19 DIAGNOSIS — G8929 Other chronic pain: Secondary | ICD-10-CM

## 2022-06-19 DIAGNOSIS — M48062 Spinal stenosis, lumbar region with neurogenic claudication: Secondary | ICD-10-CM

## 2022-06-25 ENCOUNTER — Ambulatory Visit
Admission: RE | Admit: 2022-06-25 | Discharge: 2022-06-25 | Disposition: A | Discharge status: Home / Self Care | Payer: Medicare Other | Source: Ambulatory Visit | Attending: Physical Medicine & Rehabilitation | Admitting: Physical Medicine & Rehabilitation

## 2022-06-25 DIAGNOSIS — M5441 Lumbago with sciatica, right side: Secondary | ICD-10-CM | POA: Diagnosis present

## 2022-06-25 DIAGNOSIS — M48062 Spinal stenosis, lumbar region with neurogenic claudication: Secondary | ICD-10-CM | POA: Diagnosis not present

## 2022-06-25 DIAGNOSIS — G8929 Other chronic pain: Secondary | ICD-10-CM | POA: Diagnosis present

## 2023-07-08 ENCOUNTER — Encounter: Payer: Self-pay | Admitting: Internal Medicine

## 2023-07-15 ENCOUNTER — Ambulatory Visit
Admission: RE | Admit: 2023-07-15 | Discharge: 2023-07-15 | Disposition: A | Discharge status: Home / Self Care | Source: Ambulatory Visit | Attending: Internal Medicine | Admitting: Internal Medicine

## 2023-07-15 ENCOUNTER — Ambulatory Visit: Admitting: Anesthesiology

## 2023-07-15 ENCOUNTER — Encounter
Admission: RE | Disposition: A | Discharge status: Home / Self Care | Payer: Self-pay | Source: Ambulatory Visit | Attending: Internal Medicine

## 2023-07-15 ENCOUNTER — Encounter: Payer: Self-pay | Admitting: Internal Medicine

## 2023-07-15 DIAGNOSIS — Z7901 Long term (current) use of anticoagulants: Secondary | ICD-10-CM | POA: Insufficient documentation

## 2023-07-15 DIAGNOSIS — I10 Essential (primary) hypertension: Secondary | ICD-10-CM | POA: Diagnosis not present

## 2023-07-15 DIAGNOSIS — I2510 Atherosclerotic heart disease of native coronary artery without angina pectoris: Secondary | ICD-10-CM | POA: Insufficient documentation

## 2023-07-15 DIAGNOSIS — Z8719 Personal history of other diseases of the digestive system: Secondary | ICD-10-CM | POA: Diagnosis not present

## 2023-07-15 DIAGNOSIS — Z1289 Encounter for screening for malignant neoplasm of other sites: Secondary | ICD-10-CM | POA: Insufficient documentation

## 2023-07-15 DIAGNOSIS — R519 Headache, unspecified: Secondary | ICD-10-CM | POA: Diagnosis not present

## 2023-07-15 DIAGNOSIS — Z79899 Other long term (current) drug therapy: Secondary | ICD-10-CM | POA: Diagnosis not present

## 2023-07-15 DIAGNOSIS — K2100 Gastro-esophageal reflux disease with esophagitis, without bleeding: Secondary | ICD-10-CM | POA: Diagnosis not present

## 2023-07-15 DIAGNOSIS — K219 Gastro-esophageal reflux disease without esophagitis: Secondary | ICD-10-CM | POA: Diagnosis not present

## 2023-07-15 DIAGNOSIS — K2289 Other specified disease of esophagus: Secondary | ICD-10-CM | POA: Diagnosis not present

## 2023-07-15 DIAGNOSIS — Z7951 Long term (current) use of inhaled steroids: Secondary | ICD-10-CM | POA: Insufficient documentation

## 2023-07-15 DIAGNOSIS — Z7982 Long term (current) use of aspirin: Secondary | ICD-10-CM | POA: Diagnosis not present

## 2023-07-15 HISTORY — PX: ESOPHAGOGASTRODUODENOSCOPY: SHX5428

## 2023-07-15 SURGERY — EGD (ESOPHAGOGASTRODUODENOSCOPY)
Anesthesia: General

## 2023-07-15 MED ORDER — LIDOCAINE HCL (CARDIAC) PF 100 MG/5ML IV SOSY
PREFILLED_SYRINGE | INTRAVENOUS | Status: DC | PRN
  Administered 2023-07-15: 60 mg via INTRAVENOUS

## 2023-07-15 MED ORDER — PROPOFOL 10 MG/ML IV BOLUS
INTRAVENOUS | Status: DC | PRN
  Administered 2023-07-15: 60 mg via INTRAVENOUS

## 2023-07-15 MED ORDER — SODIUM CHLORIDE 0.9 % IV SOLN
INTRAVENOUS | Status: DC
  Administered 2023-07-15: 500 mL via INTRAVENOUS

## 2023-07-15 MED ORDER — LIDOCAINE HCL (PF) 2 % IJ SOLN
INTRAMUSCULAR | Status: AC
  Filled 2023-07-15: qty 5

## 2023-07-15 MED ORDER — PROPOFOL 500 MG/50ML IV EMUL
INTRAVENOUS | Status: DC | PRN
  Administered 2023-07-15: 100 ug/kg/min via INTRAVENOUS

## 2023-07-15 NOTE — Interval H&P Note (Signed)
 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  The various methods of treatment have been discussed with the patient and family. After consideration of risks, benefits and other options for treatment, the patient has consented to  Procedure(s): EGD (ESOPHAGOGASTRODUODENOSCOPY) (N/A) as a surgical intervention.  The patient's history has been reviewed, patient examined, no change in status, stable for surgery.  I have reviewed the patient's chart and labs.  Questions were answered to the patient's satisfaction.     [place], [place]

## 2023-07-15 NOTE — H&P (Signed)
 Outpatient short stay form Pre-procedure 07/15/2023 9:06 AM Colin Drake K. [name], M.D.  Primary Physician: [name], D.O.  Reason for visit:  Barrett's esophagus, GERD, hx esophagitis  History of present illness:  83  y/o patient has a personal history of Barrett's esophagus without dysplasia. Patient denies hemetemesis, nausea, vomiting, dysphagia, weight loss. Takes PPI without significant side effects.      Current Facility-Administered Medications:    0.9 %  sodium chloride infusion, , Intravenous, Continuous, [name], [name], MD, Last Rate: 20 mL/hr at 07/15/23 0857, 500 mL at 07/15/23 0857  Medications Prior to Admission  Medication Sig Dispense Refill Last Dose/Taking   albuterol (PROVENTIL) (2.5 MG/3ML) 0.083% nebulizer solution Take 2.5 mg by nebulization every 6 (six) hours as needed for wheezing or shortness of breath.   Past Week   albuterol (VENTOLIN HFA) 108 (90 Base) MCG/ACT inhaler Inhale 2 puffs into the lungs every 4 (four) hours as needed for wheezing.   Past Week   Ascorbic Acid (VITAMIN C) 1000 MG tablet Take 1,000 mg by mouth daily.   Past Week   atenolol (TENORMIN) 25 MG tablet Take by mouth daily.   07/14/2023   azelastine (ASTELIN) 0.1 % nasal spray Place into both nostrils 2 (two) times daily. Use in each nostril as directed   Past Week   cetirizine (ZYRTEC) 10 MG tablet Take 10 mg by mouth daily.   Past Week   cholecalciferol (VITAMIN D3) 10 MCG (400 UNIT) TABS tablet Take 1,000 Units by mouth daily.   Past Week   Cholecalciferol (VITAMIN D3) 2000 units TABS Take by mouth daily.   Past Week   doxepin (SINEQUAN) 10 MG capsule Take 10 mg by mouth at bedtime as needed.   07/14/2023   EPINEPHrine 0.3 mg/0.3 mL IJ SOAJ injection Inject 0.3 mg into the muscle as needed for anaphylaxis.   Past Month   finasteride (PROSCAR) 5 MG tablet Take 5 mg by mouth daily.   07/14/2023   fluticasone (FLONASE) 50 MCG/ACT nasal spray Place 2 sprays into both nostrils daily. Place  1-2 sprays in both nostrils daily as needed for allergies   Past Week   Fluticasone-Salmeterol (ADVAIR) 250-50 MCG/DOSE AEPB Inhale 1 puff into the lungs 2 (two) times daily.   Past Week   Ginger, Zingiber officinalis, (GINGER ROOT) 550 MG CAPS Take 550 mg by mouth 2 (two) times daily.   Past Week   Ginkgo Biloba Extract 60 MG CAPS Take by mouth 2 (two) times daily.   Past Week   metoprolol succinate (TOPROL-XL) 25 MG 24 hr tablet Take 12.5 mg by mouth daily.   07/14/2023   Multiple Vitamins-Minerals (CENTRUM SILVER PO) Take by mouth daily.   Past Week   Multiple Vitamins-Minerals (ICAPS AREDS 2) CAPS Take by mouth 2 (two) times daily.   Past Week   pantoprazole (PROTONIX) 40 MG tablet Take 40 mg by mouth 2 (two) times daily.   07/14/2023   Probiotic Product (PROBIOTIC-10 PO) Take 2 capsules by mouth 2 (two) times daily.   Past Week   rosuvastatin (CRESTOR) 10 MG tablet Take 10 mg by mouth daily.   07/14/2023   tamsulosin (FLOMAX) 0.4 MG CAPS capsule Take 0.4 mg by mouth daily.   07/14/2023   vitamin B-12 (CYANOCOBALAMIN) 1000 MCG tablet Take 1,000 mcg by mouth daily.   Past Week   apixaban (ELIQUIS) 5 MG TABS tablet Take 5 mg by mouth 2 (two) times daily. (Patient not taking: Reported on 05/08/2021)   07/11/2023  Evening   aspirin 81 MG tablet Take 81 mg by mouth daily. (Patient not taking: Reported on 07/15/2023)   Not Taking   DPH-Lido-AlHydr-MgHydr-Simeth (FIRST-MOUTHWASH BLM) SUSP Use as directed 5 mLs in the mouth or throat every 4 (four) hours as needed. (Patient not taking: Reported on 07/15/2023)   Not Taking     Allergies  Allergen Reactions   Codeine Nausea Only   Doxycycline    Shrimp [Shellfish Allergy]    Vancomycin      Past Medical History:  Diagnosis Date   Benign prostatic hyperplasia    Coronary artery disease    GERD (gastroesophageal reflux disease)    Headache    Hx of gastric ulcer    with UGI bleed due to NSAIDS   Hx of sepsis 04/2015   Hyperlipidemia     Hypertension    PAT (paroxysmal atrial tachycardia) (HCC) 10/15/2017   Zoster 10/14/2017   right back/flank/chest    Review of systems:  Otherwise negative.    Physical Exam  Gen: Alert, oriented. Appears stated age.  HEENT: [place]/AT. PERRLA. Lungs: CTA, no wheezes. CV: RR nl S1, S2. Abd: soft, benign, no masses. BS+ Ext: No edema. Pulses 2+    Planned procedures: Proceed with EGD. The patient understands the nature of the planned procedure, indications, risks, alternatives and potential complications including but not limited to bleeding, infection, perforation, damage to internal organs and possible oversedation/side effects from anesthesia. The patient agrees and gives consent to proceed.  Please refer to procedure notes for findings, recommendations and patient disposition/instructions.     [name] [name], M.D. Gastroenterology 07/15/2023  9:06 AM

## 2023-07-15 NOTE — Op Note (Signed)
 [facility] Gastroenterology Patient Name: Colin Drake Procedure Date: 07/15/2023 8:59 AM MRN: [id number] Account #: [id number] Date of Birth: 07/16/1940 Admit Type: Outpatient Age: 83 Room: [facility] ENDO ROOM 2 Gender: Male Note Status: Finalized Instrument Name: Upper Endoscope [id number] Procedure:             Upper GI endoscopy Indications:           Surveillance for malignancy due to personal history of                         Barrett's esophagus Providers:             [name]. [name] MD, MD Referring MD:          Araceli Bouche (Referring MD) Medicines:             Propofol per Anesthesia Complications:         No immediate complications. Estimated blood loss:                         Minimal. Procedure:             Pre-Anesthesia Assessment:                        - The risks and benefits of the procedure and the                         sedation options and risks were discussed with the                         patient. All questions were answered and informed                         consent was obtained.                        - Patient identification and proposed procedure were                         verified prior to the procedure by the nurse. The                         procedure was verified in the procedure room.                        - ASA Grade Assessment: III - A patient with severe                         systemic disease.                        - After reviewing the risks and benefits, the patient                         was deemed in satisfactory condition to undergo the                         procedure.                        After obtaining informed  consent, the endoscope was                         passed under direct vision. Throughout the procedure,                         the patient's blood pressure, pulse, and oxygen                         saturations were monitored continuously. The Endoscope                         was  introduced through the mouth, and advanced to the                         third part of duodenum. The upper GI endoscopy was                         accomplished without difficulty. The patient tolerated                         the procedure well. Findings:      One tongue of salmon-colored mucosa was present. No other visible       abnormalities were present. The maximum longitudinal extent of these       esophageal mucosal changes was 0.5 cm in length. Mucosa was biopsied       with a cold forceps for histology in a targeted manner. One specimen       bottle was sent to pathology. Estimated blood loss was minimal.      The stomach was normal.      The examined duodenum was normal.      The exam was otherwise without abnormality. Impression:            - Salmon-colored mucosa [Diagnosis (Autoimpressions)].                         Biopsied.                        - Normal stomach.                        - Normal examined duodenum.                        - The examination was otherwise normal. Recommendation:        - Patient has a contact number available for                         emergencies. The signs and symptoms of potential                         delayed complications were discussed with the patient.                         Return to normal activities tomorrow. Written                         discharge instructions were provided to the patient.                        -  Resume previous diet.                        - Continue present medications.                        - Await pathology results.                        - No repeat upper endoscopy for surveillance of                         Barrett's esophagus due to today's normal exam (if                         confirmed by pending pathology results).                        - Return to GI office PRN.                        - The findings and recommendations were discussed with                         the patient. Procedure  Code(s):     --- Professional ---                        [phone], Esophagogastroduodenoscopy, flexible,                         transoral; with biopsy, single or multiple Diagnosis Code(s):     --- Professional ---                        K22.70, Barrett's esophagus without dysplasia CPT copyright 2022 American Medical Association. All rights reserved. The codes documented in this report are preliminary and upon coder review may  be revised to meet current compliance requirements. Stanton Kidney MD, MD 07/15/2023 9:21:53 AM This report has been signed electronically. Number of Addenda: 0 Note Initiated On: 07/15/2023 8:59 AM Estimated Blood Loss:  Estimated blood loss was minimal. Estimated blood loss                         was minimal.      [facility]

## 2023-07-15 NOTE — Anesthesia Preprocedure Evaluation (Signed)
 Anesthesia Evaluation  Patient identified by MRN, date of birth, ID band Patient awake    Reviewed: Allergy & Precautions, NPO status , Patient's Chart, lab work & pertinent test results  History of Anesthesia Complications Negative for: history of anesthetic complications  Airway Mallampati: III  TM Distance: >3 FB Neck ROM: full    Dental no notable dental hx.    Pulmonary neg pulmonary ROS   Pulmonary exam normal        Cardiovascular hypertension, On Medications + CAD  Normal cardiovascular exam     Neuro/Psych  Headaches  negative psych ROS   GI/Hepatic Neg liver ROS,GERD  Medicated,,  Endo/Other  negative endocrine ROS    Renal/GU negative Renal ROS  negative genitourinary   Musculoskeletal   Abdominal   Peds  Hematology negative hematology ROS (+)   Anesthesia Other Findings Past Medical History: No date: Benign prostatic hyperplasia No date: Coronary artery disease No date: GERD (gastroesophageal reflux disease) No date: Headache No date: Hx of gastric ulcer     Comment:  with UGI bleed due to NSAIDS 04/2015: Hx of sepsis No date: Hyperlipidemia No date: Hypertension 10/15/2017: PAT (paroxysmal atrial tachycardia) (HCC) 10/14/2017: Zoster     Comment:  right back/flank/chest  Past Surgical History: 06/04/2016: CATARACT EXTRACTION W/PHACO; Left     Comment:  Procedure: CATARACT EXTRACTION PHACO AND INTRAOCULAR               LENS PLACEMENT (IOC)  Left Toric lens;  Surgeon: Lockie Mola, MD;  Location: [facility];  Service:              Ophthalmology;  Laterality: Left;  Toric lens 07/30/2016: CATARACT EXTRACTION W/PHACO; Right     Comment:  Procedure: CATARACT EXTRACTION PHACO AND INTRAOCULAR               LENS PLACEMENT (IOC)  toric right;  Surgeon: Lockie Mola, MD;  Location: [facility];  Service:              Ophthalmology;   Laterality: Right;  IVA               TOPICAL RIGHT TORIC 05/11/2019: COLONOSCOPY WITH PROPOFOL; N/A     Comment:  Procedure: COLONOSCOPY WITH PROPOFOL;  Surgeon: Tol[place],               [name], MD;  Location: [facility];  Service:               Gastroenterology;  Laterality: N/A; 1989: CORONARY ANGIOPLASTY 05/08/2021: ESOPHAGOGASTRODUODENOSCOPY; N/A     Comment:  Procedure: ESOPHAGOGASTRODUODENOSCOPY (EGD);  Surgeon:               Toledo, [name], MD;  Location: [facility];                Service: Gastroenterology;  Laterality: N/A; No date: EYE SURGERY 06/25/2016: REPOSITION OF LENS; Left     Comment:  Procedure: REPOSITION OF LENS;  Surgeon: Lockie Mola, MD;  Location: [facility];  Service:              Ophthalmology;  Laterality: Left;  BMI    Body Mass Index: 22.87 kg/m      Reproductive/Obstetrics negative OB ROS  Anesthesia Physical Anesthesia Plan  ASA: 3  Anesthesia Plan: General   Post-op Pain Management: Minimal or no pain anticipated   Induction: Intravenous  PONV Risk Score and Plan: 1 and Propofol infusion and TIVA  Airway Management Planned: Natural Airway and Nasal Cannula  Additional Equipment:   Intra-op Plan:   Post-operative Plan:   Informed Consent: I have reviewed the patients History and Physical, chart, labs and discussed the procedure including the risks, benefits and alternatives for the proposed anesthesia with the patient or authorized representative who has indicated his/her understanding and acceptance.     Dental Advisory Given  Plan Discussed with: Anesthesiologist, CRNA and Surgeon  Anesthesia Plan Comments: (Patient consented for risks of anesthesia including but not limited to:  - adverse reactions to medications - risk of airway placement if required - damage to eyes, teeth, lips or other oral mucosa - nerve damage due to  positioning  - sore throat or hoarseness - Damage to heart, brain, nerves, lungs, other parts of body or loss of life  Patient voiced understanding and assent.)       Anesthesia Quick Evaluation

## 2023-07-15 NOTE — Anesthesia Postprocedure Evaluation (Signed)
 Anesthesia Post Note  Patient: Colin Drake  Procedure(s) Performed: EGD (ESOPHAGOGASTRODUODENOSCOPY)  Patient location during evaluation: Endoscopy Anesthesia Type: General Level of consciousness: awake and alert Pain management: pain level controlled Vital Signs Assessment: post-procedure vital signs reviewed and stable Respiratory status: spontaneous breathing, nonlabored ventilation, respiratory function stable and patient connected to nasal cannula oxygen Cardiovascular status: blood pressure returned to baseline and stable Postop Assessment: no apparent nausea or vomiting Anesthetic complications: no   No notable events documented.   Last Vitals:  Vitals:   07/15/23 0930 07/15/23 0940  BP: (!) 146/73 (!) 158/76  Pulse: (!) 55 (!) 50  Resp: 11 15  Temp:    SpO2: 99% 99%    Last Pain:  Vitals:   07/15/23 0920  TempSrc: Temporal  PainSc: Asleep                 Louie Boston

## 2023-07-15 NOTE — Transfer of Care (Signed)
 Immediate Anesthesia Transfer of Care Note  Patient: Colin Drake  Procedure(s) Performed: EGD (ESOPHAGOGASTRODUODENOSCOPY)  Patient Location: PACU and Endoscopy Unit  Anesthesia Type:General  Level of Consciousness: drowsy  Airway & Oxygen Therapy: Patient Spontanous Breathing  Post-op Assessment: Report given to RN and Post -op Vital signs reviewed and stable  Post vital signs: Reviewed and stable  Last Vitals:  Vitals Value Taken Time  BP 130/72 07/15/23 0920  Temp 35.8 C 07/15/23 0920  Pulse 57 07/15/23 0920  Resp 19 07/15/23 0920  SpO2 96 % 07/15/23 0920    Last Pain:  Vitals:   07/15/23 0920  TempSrc: Temporal  PainSc: Asleep         Complications: No notable events documented.

## 2023-07-16 ENCOUNTER — Encounter: Payer: Self-pay | Admitting: Internal Medicine

## 2023-07-16 LAB — SURGICAL PATHOLOGY
